# Patient Record
Sex: Female | Born: 1970 | Race: White | Hispanic: No
Health system: Midwestern US, Community
[De-identification: ages and names within clinical notes are randomized; demographics above are authoritative.]

## PROBLEM LIST (undated history)

## (undated) DIAGNOSIS — H469 Unspecified optic neuritis: Secondary | ICD-10-CM

## (undated) DIAGNOSIS — F329 Major depressive disorder, single episode, unspecified: Secondary | ICD-10-CM

## (undated) DIAGNOSIS — F419 Anxiety disorder, unspecified: Secondary | ICD-10-CM

## (undated) DIAGNOSIS — F32A Depression, unspecified: Secondary | ICD-10-CM

## (undated) DIAGNOSIS — F341 Dysthymic disorder: Secondary | ICD-10-CM

## (undated) DIAGNOSIS — Z1231 Encounter for screening mammogram for malignant neoplasm of breast: Principal | ICD-10-CM

## (undated) HISTORY — PX: CHOLECYSTECTOMY: SHX55

## (undated) HISTORY — DX: Anxiety disorder, unspecified: F41.9

## (undated) HISTORY — PX: ECTOPIC PREGNANCY SURGERY: SHX613

## (undated) HISTORY — DX: Depression, unspecified: F32.A

## (undated) HISTORY — DX: Major depressive disorder, single episode, unspecified: F32.9

## (undated) HISTORY — DX: Unspecified optic neuritis: H46.9

---

## 1998-01-03 ENCOUNTER — Other Ambulatory Visit: Admission: RE | Admit: 1998-01-03 | Discharge: 1998-01-03 | Payer: Self-pay | Admitting: Obstetrics and Gynecology

## 1998-07-23 ENCOUNTER — Inpatient Hospital Stay (HOSPITAL_COMMUNITY): Admission: AD | Admit: 1998-07-23 | Discharge: 1998-07-25 | Payer: Self-pay | Admitting: Obstetrics and Gynecology

## 1998-08-20 ENCOUNTER — Emergency Department (HOSPITAL_COMMUNITY): Admission: EM | Admit: 1998-08-20 | Discharge: 1998-08-20 | Payer: Self-pay | Admitting: Emergency Medicine

## 1998-08-25 ENCOUNTER — Other Ambulatory Visit: Admission: RE | Admit: 1998-08-25 | Discharge: 1998-08-25 | Payer: Self-pay | Admitting: Obstetrics and Gynecology

## 1998-09-05 ENCOUNTER — Encounter: Payer: Self-pay | Admitting: Internal Medicine

## 1998-09-05 ENCOUNTER — Ambulatory Visit (HOSPITAL_COMMUNITY): Admission: RE | Admit: 1998-09-05 | Discharge: 1998-09-05 | Payer: Self-pay | Admitting: Internal Medicine

## 1998-10-04 ENCOUNTER — Observation Stay (HOSPITAL_COMMUNITY): Admission: RE | Admit: 1998-10-04 | Discharge: 1998-10-05 | Payer: Self-pay | Admitting: General Surgery

## 1999-09-18 ENCOUNTER — Inpatient Hospital Stay (HOSPITAL_COMMUNITY): Admission: AD | Admit: 1999-09-18 | Discharge: 1999-09-18 | Payer: Self-pay | Admitting: Obstetrics and Gynecology

## 1999-09-19 ENCOUNTER — Inpatient Hospital Stay (HOSPITAL_COMMUNITY): Admission: AD | Admit: 1999-09-19 | Discharge: 1999-09-21 | Payer: Self-pay | Admitting: Obstetrics and Gynecology

## 1999-10-25 ENCOUNTER — Other Ambulatory Visit: Admission: RE | Admit: 1999-10-25 | Discharge: 1999-10-25 | Payer: Self-pay | Admitting: Obstetrics and Gynecology

## 2000-10-30 ENCOUNTER — Other Ambulatory Visit: Admission: RE | Admit: 2000-10-30 | Discharge: 2000-10-30 | Payer: Self-pay | Admitting: Obstetrics and Gynecology

## 2001-10-10 ENCOUNTER — Encounter: Admission: RE | Admit: 2001-10-10 | Discharge: 2001-10-10 | Payer: Self-pay | Admitting: Internal Medicine

## 2001-10-10 ENCOUNTER — Encounter: Payer: Self-pay | Admitting: Internal Medicine

## 2001-12-16 ENCOUNTER — Other Ambulatory Visit: Admission: RE | Admit: 2001-12-16 | Discharge: 2001-12-16 | Payer: Self-pay | Admitting: Obstetrics and Gynecology

## 2003-06-24 ENCOUNTER — Other Ambulatory Visit: Admission: RE | Admit: 2003-06-24 | Discharge: 2003-06-24 | Payer: Self-pay | Admitting: Obstetrics and Gynecology

## 2007-01-11 ENCOUNTER — Inpatient Hospital Stay (HOSPITAL_COMMUNITY): Admission: EM | Admit: 2007-01-11 | Discharge: 2007-01-13 | Payer: Self-pay | Admitting: Emergency Medicine

## 2007-02-24 ENCOUNTER — Ambulatory Visit (HOSPITAL_COMMUNITY): Admission: RE | Admit: 2007-02-24 | Discharge: 2007-02-24 | Payer: Self-pay | Admitting: *Deleted

## 2007-02-26 ENCOUNTER — Ambulatory Visit (HOSPITAL_COMMUNITY): Admission: RE | Admit: 2007-02-26 | Discharge: 2007-02-26 | Payer: Self-pay | Admitting: *Deleted

## 2008-06-05 IMAGING — RF DG FLUORO GUIDE NDL PLC/BX
1 series · 1 of 1 positions shown · non-contrast
Comparison: none

CLINICAL DATA: Recent optic neuritis.  Query MS.  
DIAGNOSTIC FLUROSCOPIC LUMBAR PUNCTURE:

[Series 1: run · 1 of 1 slices shown]
[im 1/1]
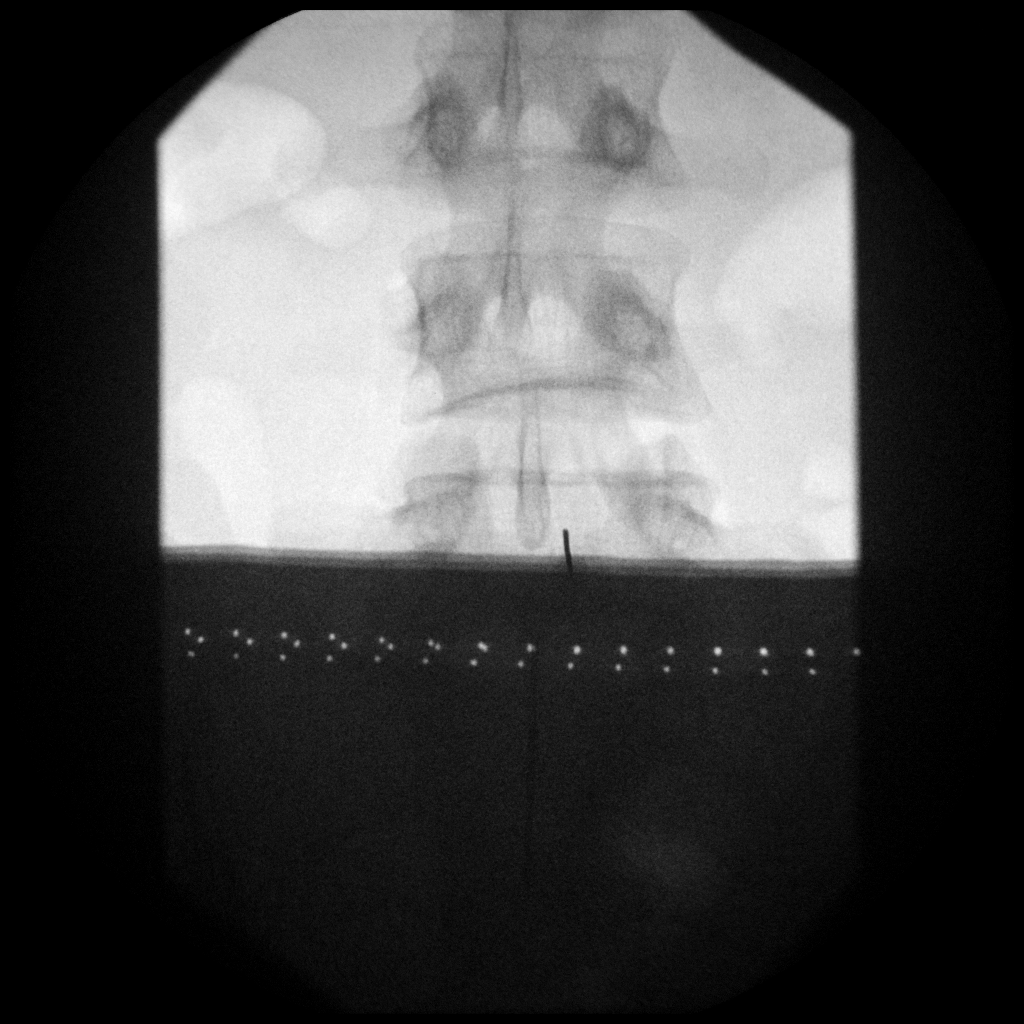

[1 of 1 positions shown; findings below may reference images not displayed]

FINDINGS: Informed consent was obtained.  Sterile preparation of the patient?s skin with Betadine x 3.  Local anesthesia was performed with subcutaneous injection of 1% Lidocaine.  I inserted a 20 gauge spinal needle into the subarachnoid space at the L4-5 level.  Opening pressure was 16 cm H2O.  I removed approximately 7 cc of clear, colorless CSF for requested lab studies.  Patient tolerated the procedure well without immediate complications.  Appropriate orders were written.
IMPRESSION: Successful diagnostic lumbar puncture.  Opening pressure was 16 cm H2O.

## 2011-02-09 NOTE — Discharge Summary (Signed)
Lauren Ballard, Lauren Ballard               ACCOUNT NO.:  [id number]   MEDICAL RECORD NO.:  [id number]          PATIENT TYPE:  INP   LOCATION:  3013                         FACILITY:  [facility]   PHYSICIAN:  Bevelyn Buckles. Champey, M.D.DATE OF BIRTH:  02/01/1971   DATE OF ADMISSION:  01/11/2007  DATE OF DISCHARGE:  01/13/2007                               DISCHARGE SUMMARY   REASON FOR ADMISSION:  Right optic neuritis with vision loss.   DISCHARGE MEDICATIONS:  Include a 6 day prednisone dose taper.   HOSPITAL COURSE:  This is a 40 year old Caucasian female with no  significant past medical history who presented with decreased vision in  the right eye and was found to have optic nerve edema and swelling.  The  patient was admitted.  She had vision loss for IV steroid treatment.  She was given 3 days of IV steroids at 1 gram per day.  The patient also  had an MRI of the brain for any demyelinating lesions.  The MRI of the  brain did not show any cortical lesions, it did show abnormal  enhancement of the right optic nerve.  The patient tolerated the IV  steroids well.  Vision improved.  The patient's headache and right eye  pain also improved at the first initial dose of steroids.  The patient  has an uncomplicated hospital stay and was stable on discharge with the  vision actually improved.   LABORATORY DATA:  The patient's laboratories including CBC, coag's, and  c-Met were all normal as well.   DISCHARGE INSTRUCTIONS:  The patient may return to work and activity as  tolerated.  The patient will have a followup appointment with Dr.  Nash Shearer in 1 to 2 months time.  Office number was given.  The  prescription was given for her prednisone atper.  The patient was told  to call the office with any questions or concerns.      Bevelyn Buckles. Nash Shearer, M.D.  Electronically Signed     DRC/MEDQ  D:  01/13/2007  T:  01/13/2007  Job:  [id number]

## 2011-02-09 NOTE — H&P (Signed)
Lauren Ballard, Lauren Ballard               ACCOUNT NO.:  [id number]   MEDICAL RECORD NO.:  [id number]          PATIENT TYPE:  EMS   LOCATION:  MAJO                         FACILITY:  [facility]   PHYSICIAN:  Bevelyn Buckles. Champey, M.D.DATE OF BIRTH:  [birth year]   DATE OF ADMISSION:  01/11/2007  DATE OF DISCHARGE:                              HISTORY & PHYSICAL   REQUESTING PHYSICIAN:  Dr. Hyacinth Meeker   REASON FOR ADMISSION:  Optic neuritis with decreased vision.   HISTORY OF PRESENT ILLNESS:  Lauren Ballard is a 40 year old Caucasian female  with no significant past medical history who presents with decreased  vision in her right eye.  I spoke with Dr. Hyacinth Meeker on the phone and  stated symptoms started five days ago with decreased vision in the  superior visual field.  Patient stated that she had a headache and  common cold in the past four days, however visual disturbance started  last evening around 10:00 p.m. when she developed visual field loss  superiorly in her right eye with a brown-like film.  She also stated  that her right eye hurt to move and she had severe right eye and head  pain since last evening.  Her vision got slightly worse over the day,  saw Dr. Hyacinth Meeker today who saw her and noticed a right Marcus Gunn pupil,  superior visual loss in the right eye and optic nerve edema.  I  discussed with Dr. Hyacinth Meeker and discussed to bring the patient to the  emergency room for IV steroids and admission.  Patient denies any other  symptoms of focal weakness, numbness, speech changes, swallowing  problems, chewing problems, dizziness, vertigo, falls or loss of  consciousness.   PAST MEDICAL HISTORY:  None.   CURRENT MEDICATIONS:  None.   ALLERGIES:  ARE TO PENICILLIN, AMOXICILLIN AND SULFA.   FAMILY HISTORY:  Positive for stroke and breast cancer.   SOCIAL HISTORY:  Patient lives with family, socially drinks alcohol,  denies any smoking or drug use.   REVIEW OF SYSTEMS:  Positive as per HPI and also  for a recent cold four  days ago.  Review of systems negative as per the HPI and greater than  eight other organ systems.   PHYSICAL EXAMINATION:  VITALS:  Temperature is 98.1, blood pressure  149/89, respirations 18, pulse is 94.  HEENT:  Normocephalic, atraumatic.  Extraocular muscles are intact, they  are painful to move.  Right eye has superior visual loss.  NECK:  Supple.  HEART:  Regular.  LUNGS:  Clear.  ABDOMEN:  Soft, nontender.  EXTREMITIES:  Show good pulses.  NEUROLOGICAL EXAMINATION:  Patient is awake, alert and oriented.  Language is fluent.  Cranial nerves:  Right eye has superior visual loss  and patient has painful eye movements on the right.  Face is symmetric.  Tongue is midline.  Motor examination shows 5/5 strength and normal tone  in all four extremities.  No drift is noted.  Sensory examination is  within normal limits to light touch.  Reflexes are 1 to 2+ throughout.  Cerebellar flexion is within normal limits Finger-to-nose  and heel-to-  shin.  Gait is unremarkable.  Patient does not have a Romberg sign  present.   IMPRESSION:  This is a 40 year old Caucasian female with optic neuritis  and visual loss in right eye.   Will admit the patient for three days of IV steroids followed by a p.o.  steroid taper.  We will check an MRI of the brain to look for any MS or  suspicious demyelinating lesions.  If demyelinating lesions are seen, we  will consider lumbar puncture for further workup.  We will follow  patient while she is in the hospital.      Bevelyn Buckles. Nash Shearer, M.D.  Electronically Signed     DRC/MEDQ  D:  01/11/2007  T:  01/12/2007  Job:  [id number]

## 2011-02-09 NOTE — H&P (Signed)
[facility]  Patient:    Lauren Ballard                       MRN: [id number] Adm. Date:  [id number] Attending:  Lenoard Aden CC:         [facility]                         History and Physical  CHIEF COMPLAINT:              Presumed macrosomia with history of shoulder dystocia and 4th degree laceration.  HISTORY OF PRESENT ILLNESS:   The patient is a 40 year old white female, G3, P1-0-1-1, EDD of September 23, 1999, at 39+ weeks, with an estimated fetal weight of approximately 8-1/2-9 pounds, and greater than the 90th percentile on ultrasound. History of suspected macrosomia, history of previous macrosomia with prolonged second stage shoulder dystocia and 4th degree laceration who presents with favorable cervix and elective induction now at term.  PAST MEDICAL HISTORY:         1. Remarkable for a vaginal delivery October 1999 of                                  a 9 pound female with 4th degree laceration and                                  shoulder dystocia.                               2. History of ectopic 1988.                               3. History of previous Clomid induced pregnancy.   ALLERGIES:                    PENICILLIN AND SULFA.  MEDICATIONS:                  1. Prenatal vitamins.                               2. Iron.  SOCIAL HISTORY:               Noncontributory.  FAMILY HISTORY:               Family history of _______ disease, rheumatoid arthritis, and breast cancer.  PAST SURGICAL HISTORY:        1. Remarkable for laparoscopy in 1998.                               2. Gallbladder in January 2000.  PREGNANCY HISTORY:            Remarkable for a blood type of O positive, Rh antibody negative, VDRL nonreactive, Rubella immune, hepatitis B surface antigen negative, group beta Streptococcus performed was negative.  PHYSICAL EXAMINATION  GENERAL APPEARANCE:           Well-developed, well-nourished white female,  in no apparent distress.  HEENT:  Normal limits.  LUNGS:                        Clear.  CARDIAC:                      Heart regular rate and rhythm.  ABDOMEN:                      Soft, gravid, nontender, estimated fetal weight 8 pounds.  BACK:                         No CVA tenderness.  PELVIC:                       Cervix is 2-3 cm, 50% vertex, and -1.  Artificial  rupture of membranes, clear.  EXTREMITIES:                  Reveal no cords.  NEUROLOGIC:                   Nonfocal.  IMPRESSION:                   1. Intrauterine pregnancy 39 weeks.                               2. Previous history of macrosomia with 4th degree                                  laceration, shoulder dystocia, and prolonged  active phase with presumed macrosomia in this                                  current pregnancy.  PLAN:                         To proceed with Pitocin induction and careful attempt at vaginal delivery. DD:  09/19/99 TD:  09/19/99 Job: [id number]

## 2011-07-12 LAB — CSF CELL COUNT WITH DIFFERENTIAL
Eosinophils, CSF: 0
RBC Count, CSF: 4 — ABNORMAL HIGH
RBC Count, CSF: 45 — ABNORMAL HIGH
Segmented Neutrophils-CSF: 0
Tube #: 1
Tube #: 3
WBC, CSF: 1
WBC, CSF: 2

## 2011-07-12 LAB — OLIGOCLONAL BANDS, CSF + SERM
Albumin Index: 3.2 ratio (ref 0.0–9.0)
Albumin, CSF: 14 mg/dL (ref 0–35)
CSF Oligoclonal Bands: NEGATIVE
IgG Index, CSF: 0.47 ratio (ref 0.28–0.66)
IgG, Serum: 1000 mg/dL (ref 768–1632)
MS CNS IgG Synthesis Rate: 0 mg/d (ref 0.0–8.0)

## 2011-07-12 LAB — HSV PCR: HSV, PCR: NEGATIVE

## 2011-07-12 LAB — VDRL, CSF: VDRL Quant, CSF: NONREACTIVE

## 2011-07-12 LAB — PROTEIN AND GLUCOSE, CSF
Glucose, CSF: 51
Total  Protein, CSF: 25

## 2012-08-16 ENCOUNTER — Ambulatory Visit: Payer: BC Managed Care – PPO | Admitting: Family Medicine

## 2012-08-16 VITALS — BP 125/85 | HR 80 | Temp 99.3°F | Resp 18 | Ht 59.5 in | Wt 109.0 lb

## 2012-08-16 DIAGNOSIS — N390 Urinary tract infection, site not specified: Secondary | ICD-10-CM

## 2012-08-16 LAB — POCT UA - MICROSCOPIC ONLY

## 2012-08-16 LAB — POCT URINALYSIS DIPSTICK
Bilirubin, UA: NEGATIVE
Glucose, UA: NEGATIVE
Nitrite, UA: POSITIVE
Spec Grav, UA: 1.015
Urobilinogen, UA: 1

## 2012-08-16 MED ORDER — CIPROFLOXACIN HCL 250 MG PO TABS: 250.0000 mg | ORAL_TABLET | Freq: Two times a day (BID) | ORAL | Status: DC

## 2012-08-16 NOTE — Progress Notes (Signed)
[facility]:  Office Visit  Chief Complaint:  Chief Complaint  Patient presents with  . Urinary Tract Infection    HPI: Lauren Ballard is a 41 y.o. female who complains of  Burning, urgency and achiness in legs. Does not get up and urinate. Denies fevers, chills but does feel warm. Had sex last night and started having sxs.  No back pain, no pelvic pain  History reviewed. No pertinent past medical history. Past Surgical History  Procedure Date  . Cholecystectomy   . Ectopic pregnancy surgery    History   Social History  . Marital Status: Married    Spouse Name: N/A    Number of Children: N/A  . Years of Education: N/A   Social History Main Topics  . Smoking status: Never Smoker   . Smokeless tobacco: None  . Alcohol Use: Yes     Comment: 3 x per week  . Drug Use: No  . Sexually Active: None   Other Topics Concern  . None   Social History Narrative  . None   Family History  Problem Relation Age of Onset  . Cancer Mother   . Cancer Maternal Grandfather   . Cancer Paternal Grandmother    Allergies  Allergen Reactions  . Amoxicillin   . Penicillins   . Sulfur    Prior to Admission medications   Not on File     ROS: The patient denies fevers, chills, night sweats, unintentional weight loss, chest pain, palpitations, wheezing, dyspnea on exertion, nausea, vomiting, abdominal pain, , hematuria, melena, numbness, weakness, or tingling  All other systems have been reviewed and were otherwise negative with the exception of those mentioned in the HPI and as above.    PHYSICAL EXAM: Filed Vitals:   08/16/12 1611  BP: 125/85  Pulse: 80  Temp: 99.3 F (37.4 C)  Resp: 18   Filed Vitals:   08/16/12 1611  Weight: 109 lb (49.442 kg)   There is no height on file to calculate BMI.  General: Alert, no acute distress HEENT:  Normocephalic, atraumatic, oropharynx patent.  Cardiovascular:  Regular rate and rhythm, no rubs murmurs or gallops.   No Carotid bruits, radial pulse intact. No pedal edema.  Respiratory: Clear to auscultation bilaterally.  No wheezes, rales, or rhonchi.  No cyanosis, no use of accessory musculature GI: No organomegaly, abdomen is soft and non-tender, positive bowel sounds.  No masses. Skin: No rashes. Neurologic: Facial musculature symmetric. Psychiatric: Patient is appropriate throughout our interaction. Lymphatic: No cervical lymphadenopathy Musculoskeletal: Gait intact. NO CVA tenderness   LABS: Results for orders placed in visit on 08/16/12  POCT URINALYSIS DIPSTICK      Component Value Range   Color, UA dark yellow     Clarity, UA clear     Glucose, UA neg     Bilirubin, UA neg     Ketones, UA neg     Spec Grav, UA 1.015     Blood, UA trace     pH, UA 7.0     Protein, UA neg     Urobilinogen, UA 1.0     Nitrite, UA pos     Leukocytes, UA large (3+)    POCT UA - MICROSCOPIC ONLY      Component Value Range   WBC, Ur, HPF, POC tntc     RBC, urine, microscopic tntc     Bacteria, U Microscopic 3+     Mucus, UA neg  Epithelial cells, urine per micros 1-2     Crystals, Ur, HPF, POC neg     Casts, Ur, LPF, POC neg     Yeast, UA neg       EKG/XRAY:   Primary read interpreted by Dr. Conley Rolls at [facility].   ASSESSMENT/PLAN: Encounter Diagnosis  Name Primary?  . UTI (lower urinary tract infection) Yes   Urine culture pending Cipro 250 mg BID x 3 days Last urine cx was resistant to macrobid    Lauren Albergo PHUONG, DO 08/16/2012 4:42 PM

## 2012-08-19 LAB — URINE CULTURE: Colony Count: >=100000

## 2012-08-20 ENCOUNTER — Telehealth: Payer: Self-pay | Admitting: Family Medicine

## 2012-08-20 DIAGNOSIS — N390 Urinary tract infection, site not specified: Secondary | ICD-10-CM

## 2012-08-20 MED ORDER — CIPROFLOXACIN HCL 250 MG PO TABS: 250.0000 mg | ORAL_TABLET | Freq: Two times a day (BID) | ORAL | Status: DC

## 2012-08-20 NOTE — Telephone Encounter (Signed)
Called pt regarding urine culture. She is better but sometimes she gets repeat UTIs. I will give her a refill on the cipro since I wanted to make sure the abx was sensitive ti bacteria before giving her a longer regiment.

## 2012-10-29 ENCOUNTER — Ambulatory Visit (INDEPENDENT_AMBULATORY_CARE_PROVIDER_SITE_OTHER): Payer: BC Managed Care – PPO | Admitting: Physician Assistant

## 2012-10-29 ENCOUNTER — Encounter: Payer: Self-pay | Admitting: Physician Assistant

## 2012-10-29 VITALS — BP 110/66 | HR 67 | Temp 98.1°F | Resp 16 | Ht 60.0 in | Wt 114.4 lb

## 2012-10-29 DIAGNOSIS — F329 Major depressive disorder, single episode, unspecified: Secondary | ICD-10-CM

## 2012-10-29 MED ORDER — CITALOPRAM HYDROBROMIDE 10 MG PO TABS: 10.0000 mg | ORAL_TABLET | Freq: Every day | ORAL | Status: DC

## 2012-10-29 NOTE — Progress Notes (Signed)
   [address] [place] [address]   Phone [phone]  Subjective:    Patient ID: Lauren Ballard, female    DOB: [birth year], 42 y.o.   MRN: [id number]  HPI  Pt presents to clinic at the request of her therapist Dr Ledon Snare.  She has been dealing with depression for several years.  Start with marital tensions and she was started on Prozac and it helped but then she started to noticed that her lack of attention and SE were disturbing to her so she was changed to Zoloft.  She felt that it helped a lot but about 6 months ago she stopped it because she felt like she was better.  Then in Dec she felt like her mood was again depressed.  Her good friend also noticed that her mood was down again.  She has no interest in things, does not seem to care or remember anything.  Feels like when she eats good and exercises things are better but in Dec she had a lot of stuff happen to her.  She restarted her Zoloft and started counseling.  She feels like on the Zoloft she is in a fog and cannot remember things from hour to hour. ? Maybe because she just does not care about anything.  She is still separated from her husband, has 2 teenage children.  Review of Systems  Psychiatric/Behavioral: Positive for dysphoric mood and decreased concentration. Negative for sleep disturbance (Zoloft does make her a little sleepy). The patient is nervous/anxious (minor and random - not typically a problem for the patient).        Objective:   Physical Exam  Vitals reviewed. Constitutional: She appears well-developed and well-nourished.  HENT:  Head: Normocephalic and atraumatic.  Right Ear: External ear normal.  Left Ear: External ear normal.  Eyes: Conjunctivae normal are normal.  Cardiovascular: Normal rate, regular rhythm and normal heart sounds.   No murmur heard. Pulmonary/Chest: Effort normal and breath sounds normal.  Skin: Skin is warm and dry.  Psychiatric: She has a normal mood and affect. Her behavior is  normal. Judgment and thought content normal.          Assessment & Plan:   1. Depression  citalopram (CELEXA) 10 MG tablet   Will start with 1/2 tablet for the 1st couple of days to decrease her SE and then she will increase to 1 pill a day.  Recommended taking in the am.  Pt can adjust up to 2 pills qd at the direction of Dr Ledon Snare. We will plan on F/u in 2 months, if pt has increased her dose to Celexa 20mg  at the time of her RF please let me know so I can send in the medication at a higher dose.  Answered pts questions, she understands and agrees with the above plan.

## 2012-12-24 ENCOUNTER — Other Ambulatory Visit: Payer: Self-pay | Admitting: Physician Assistant

## 2012-12-24 NOTE — Telephone Encounter (Signed)
Is pt still taking 10mg  or has she increased to 20mg ?

## 2012-12-25 NOTE — Telephone Encounter (Signed)
LMOM to CB. 

## 2012-12-26 NOTE — Telephone Encounter (Signed)
LMOM w/detailed message that we need to know her current dose of Celexa to send in RF.

## 2012-12-29 NOTE — Telephone Encounter (Signed)
LMOM for pt to CB.  

## 2012-12-30 NOTE — Telephone Encounter (Signed)
[name], I haven't had any luck getting pt to call me back. Do you want to send in RF of the 10 mg, or maybe for the 20 mg w/sig 1/2 - 1 tab QD? I hate for the pt to be without.

## 2013-01-16 NOTE — Telephone Encounter (Signed)
Lauren Ballard, pt has been taking the alprazolam 0.25 mg prn.

## 2013-01-16 NOTE — Telephone Encounter (Signed)
Lauren Ballard, pt CB. [place] had [place]. Pt reported that she has not gotten her RF yet and that she has increased her dose to 20 mg QD. She requests that the Rx be sent in for the citalopram 20 mg QD. Pt also wanted me to ask you if you would be willing to Rx the alprazolam Rx that she has been taking every now and then as needed? She had originally gotten it from the provider she used to see and doesn't take it very often but is down to about 3 tablets.

## 2013-02-17 ENCOUNTER — Telehealth: Payer: Self-pay | Admitting: Physician Assistant

## 2013-02-17 MED ORDER — CITALOPRAM HYDROBROMIDE 20 MG PO TABS: 20.0000 mg | ORAL_TABLET | Freq: Every day | ORAL | Status: DC

## 2013-02-17 MED ORDER — ALPRAZOLAM 0.25 MG PO TABS: 0.2500 mg | ORAL_TABLET | Freq: Every day | ORAL | Status: DC | PRN

## 2013-02-17 NOTE — Telephone Encounter (Signed)
She is doing well on the Celexa but needs a higher dose.  She is also having trouble on the wknds with anxiety because it is the 1st time in a long time she has been alone and needs something on a prn basis for anxiety.  Please send med to pharmacy for me - thanks.  Pt knows you do not need to call her.

## 2013-02-17 NOTE — Telephone Encounter (Signed)
FAXED

## 2013-06-03 ENCOUNTER — Other Ambulatory Visit: Payer: Self-pay | Admitting: Physician Assistant

## 2013-11-09 ENCOUNTER — Other Ambulatory Visit: Payer: Self-pay | Admitting: Physician Assistant

## 2013-11-10 NOTE — Telephone Encounter (Signed)
I have not seen patient is over a year she needs an Ov for medications.

## 2014-04-19 ENCOUNTER — Ambulatory Visit (INDEPENDENT_AMBULATORY_CARE_PROVIDER_SITE_OTHER): Payer: BC Managed Care – PPO | Admitting: Family Medicine

## 2014-04-19 ENCOUNTER — Encounter: Payer: Self-pay | Admitting: Family Medicine

## 2014-04-19 VITALS — BP 118/70 | HR 57 | Temp 98.5°F | Resp 16 | Ht 60.0 in | Wt 109.8 lb

## 2014-04-19 DIAGNOSIS — F329 Major depressive disorder, single episode, unspecified: Secondary | ICD-10-CM

## 2014-04-19 DIAGNOSIS — F3289 Other specified depressive episodes: Secondary | ICD-10-CM

## 2014-04-19 DIAGNOSIS — F32A Depression, unspecified: Secondary | ICD-10-CM

## 2014-04-19 MED ORDER — VENLAFAXINE HCL ER 37.5 MG PO CP24: 37.5000 mg | ORAL_CAPSULE | Freq: Every day | ORAL | Status: DC

## 2014-04-19 MED ORDER — ALPRAZOLAM 0.25 MG PO TABS: 0.2500 mg | ORAL_TABLET | Freq: Every day | ORAL | Status: DC | PRN

## 2014-04-19 NOTE — Progress Notes (Signed)
Subjective:    Patient ID: Lauren Ballard, female    DOB: [birth year], 43 y.o.   MRN: [id number]  04/19/2014  Medication Refill   HPI This 43 y.o. female presents for evaluation of depression with anxiety.  Last visit with Benny LennertSarah Weber on 10/2012; started on Celexa 10mg  1/2 daily for two weeks and then increased to 10mg  daily; was advised to increase to two tablets daily as recommended by therapist, Ledon SnareMcKnight. Stopped Celexa in 07/2013; was doing well emotionally until just recently.  Usually follows this trend; will take medication for 6-12 months and then be able to wean off of it; then will need to restart it 6-12 months later depending on stressors. Tried Wellbutrin but suffered with a horrible rash in the past. Works out a lot which really helps mood and overall well-being.   Underwent divorce four years ago; has joint custody of children.  Still really struggles when children not with her when they are with their father.  Celexa helps with mood but makes pt very tired and lacks motivation.  Currently admits to financial stressors; also in a relationship that is causing stressors.  Has undergone psychotherapy in the past and just saw therapist Ledon SnareMcKnight last week; plans to continue therapy.  Denies SI/HI.    Gyn/Taavon.  Review of Systems  Constitutional: Negative for fever, chills, diaphoresis and fatigue.  Neurological: Negative for dizziness, tremors, seizures, syncope, speech difficulty, weakness, light-headedness, numbness and headaches.  Psychiatric/Behavioral: Positive for sleep disturbance and dysphoric mood. Negative for suicidal ideas, self-injury and decreased concentration. The patient is nervous/anxious.     Past Medical History  Diagnosis Date  . Depression    Past Surgical History  Procedure Laterality Date  . Cholecystectomy    . Ectopic pregnancy surgery     Allergies  Allergen Reactions  . Amoxicillin   . Penicillins Hives  . Sulfur Rash   Current Outpatient  Prescriptions  Medication Sig Dispense Refill  . ALPRAZolam (XANAX) 0.25 MG tablet Take 1 tablet (0.25 mg total) by mouth daily as needed for sleep.  20 tablet  0  . citalopram (CELEXA) 20 MG tablet Take 1 tablet (20 mg total) by mouth daily. PATIENT NEEDS OFFICE VISIT FOR ADDITIONAL REFILLS  30 tablet  0  . citalopram (CELEXA) 10 MG tablet TAKE 1 TABLET BY MOUTH DAILY  30 tablet  0  . venlafaxine XR (EFFEXOR-XR) 37.5 MG 24 hr capsule Take 1 capsule (37.5 mg total) by mouth daily with breakfast.  30 capsule  5   No current facility-administered medications for this visit.   History   Social History  . Marital Status: Married    Spouse Name: N/A    Number of Children: N/A  . Years of Education: N/A   Occupational History  . Not on file.   Social History Main Topics  . Smoking status: Never Smoker   . Smokeless tobacco: Not on file  . Alcohol Use: Yes     Comment: 3 x per week  . Drug Use: No  . Sexual Activity: Not on file   Other Topics Concern  . Not on file   Social History Narrative   Marital status: divorced; dating seriously.      Two children      Lives: with 2 children       Employment       Tobacco: none      Alcohol: weekends      Drugs: none      Exercise: 5  days per week.         Objective:    BP 118/70  Pulse 57  Temp(Src) 98.5 F (36.9 C) (Oral)  Resp 16  Ht 5' (1.524 m)  Wt 109 lb 12.8 oz (49.805 kg)  BMI 21.44 kg/m2  SpO2 98%  LMP 04/17/2014 Physical Exam  Nursing note and vitals reviewed. Constitutional: She is oriented to person, place, and time. She appears well-developed and well-nourished. No distress.  HENT:  Head: Normocephalic and atraumatic.  Eyes: Conjunctivae are normal. Pupils are equal, round, and reactive to light.  Neck: Normal range of motion. Neck supple. No thyromegaly present.  Cardiovascular: Normal rate, regular rhythm and normal heart sounds.  Exam reveals no gallop and no friction rub.   No murmur  heard. Pulmonary/Chest: Effort normal and breath sounds normal. She has no wheezes. She has no rales.  Lymphadenopathy:    She has no cervical adenopathy.  Neurological: She is alert and oriented to person, place, and time. No cranial nerve deficit. She exhibits normal muscle tone. Coordination normal.  Skin: She is not diaphoretic.  Psychiatric: She has a normal mood and affect. Her behavior is normal. Judgment and thought content normal.        Assessment & Plan:   1. Depression    1. Depression: recurrent; suffered with side effects to Celexa including fatigue/hypersomnolence; rx for Effexor 37.5mg  daily provided; refill of Xanax also provided.  Highly encourage continued psychotherapy and daily exercise.  Meds ordered this encounter  Medications  . venlafaxine XR (EFFEXOR-XR) 37.5 MG 24 hr capsule    Sig: Take 1 capsule (37.5 mg total) by mouth daily with breakfast.    Dispense:  30 capsule    Refill:  5  . ALPRAZolam (XANAX) 0.25 MG tablet    Sig: Take 1 tablet (0.25 mg total) by mouth daily as needed for sleep.    Dispense:  20 tablet    Refill:  0    Return in about 2 months (around 06/20/2014) for recheck.    [name], M.D.  [facility] [address] [place], [place]  [address] [phone] phone [phone] fax

## 2014-04-20 ENCOUNTER — Telehealth: Payer: Self-pay | Admitting: Family Medicine

## 2014-04-20 NOTE — Telephone Encounter (Signed)
[facility] [address]

## 2014-05-05 ENCOUNTER — Telehealth: Payer: Self-pay

## 2014-05-05 NOTE — Telephone Encounter (Signed)
Lm for rtn call 

## 2014-05-05 NOTE — Telephone Encounter (Signed)
° ° °  Patient has a question on the medication Dr. Katrinka BlazingSmith gave her last week.  Patient did not know the name of it.  York SpanielSaid it was the only one Dr. Katrinka BlazingSmith gave her at her last OV.    Please call [phone]

## 2014-05-08 NOTE — Telephone Encounter (Signed)
Spoke with pt. She says that she has been taking the Effexor qam and she's finding that it really doesn't kick in until noon and then by night time she feels that it has worn off and her mind is racing. Wanted to know if she could take it at night. Told her that it was very reasonable to try taking it at night and see how she does and if after a couple of weeks, it's still not working for her like she wants, to RTC to discuss other options.

## 2014-06-21 ENCOUNTER — Encounter: Payer: BC Managed Care – PPO | Admitting: Family Medicine

## 2014-06-22 NOTE — Progress Notes (Signed)
This encounter was created in error - please disregard.

## 2014-10-29 ENCOUNTER — Other Ambulatory Visit: Payer: Self-pay | Admitting: Family Medicine

## 2014-11-04 ENCOUNTER — Ambulatory Visit (INDEPENDENT_AMBULATORY_CARE_PROVIDER_SITE_OTHER): Payer: BLUE CROSS/BLUE SHIELD | Admitting: Family Medicine

## 2014-11-04 ENCOUNTER — Encounter: Payer: Self-pay | Admitting: Family Medicine

## 2014-11-04 VITALS — BP 113/75 | HR 71 | Temp 97.9°F | Resp 16 | Ht 60.0 in | Wt 112.0 lb

## 2014-11-04 DIAGNOSIS — F32A Depression, unspecified: Secondary | ICD-10-CM

## 2014-11-04 DIAGNOSIS — F329 Major depressive disorder, single episode, unspecified: Secondary | ICD-10-CM

## 2014-11-04 MED ORDER — VENLAFAXINE HCL ER 37.5 MG PO CP24: ORAL_CAPSULE | ORAL | Status: DC

## 2014-11-04 NOTE — Patient Instructions (Signed)
We drew labs today to check your thyroid levels, your vitamin d level, and your vitamin b12 level. I'll be in touch with you with those results. Let's plan to slowly titrate the effexor medication up. Eventually making your way up to 75 mg per day of effexor slowly over the next few months is a good idea to maybe help you avoid the fog. Good luck with the possible job transition.  Please call and schedule with Dr Ledon SnareMcKnight to see him sometime in the next couple months. We'd like to see you back in 3 months to see how you're doing with the new dose and check in.

## 2014-11-04 NOTE — Progress Notes (Signed)
Subjective:    Patient ID: Lauren Ballard, female    DOB: Mar 25, 1971, 44 y.o.   MRN: [id number]  Chief Complaint  Patient presents with  . Medication Refill   Patient Active Problem List   Diagnosis Date Noted  . Depression 10/29/2012   Prior to Admission medications   Medication Sig Start Date End Date Taking? Authorizing Provider  ALPRAZolam (XANAX) 0.25 MG tablet Take 1 tablet (0.25 mg total) by mouth daily as needed for sleep. 04/19/14  Yes Ethelda ChickKristi M Smith, MD  venlafaxine XR (EFFEXOR-XR) 37.5 MG 24 hr capsule Take 1-2 capsules by mouth every morning with breakfast. 11/04/14  Yes Raelyn Ensignodd Charlesia Canaday, PA    Medications, allergies, past medical history, surgical history, family history, social history and problem list reviewed and updated.   HPI  [age] yof rtc for depression f/u.  Was intially seen 2 yrs ago. At that time mentioned had been depressed for few yrs since divorce. She had been seeing therpist Dr Ledon SnareMcKnight. She mentioned that she had tried both prozac and zoloft in the past. Both of these had initially worked but wore off. Then as she went up on dose she felt foggy so dc both of them. Two yrs ago we started her on celexa.  She then stopped the celexa 1.5 yrs ago due to decreased efficacy and increased fogginess. Tried wellbutrin but got rash.   She was seen here 6 months ago. Mentioned celexa was decreasing energy. Was started on effexor 37.5 mg and told to continue therapy. Was started on xanax prn for sleep.  Today she is doing ok. Divorce is 44 yrs old but continues to bother her. Her kids are 3915 and 44 yo. They split time with her and ex. She used to see them even on her off weeks to run them around places but now that one of them is 16 she is rarely seeing them on her off weeks. This really effects her. She was in a relationship for one year but broke up 6 months ago. She works from home. There are days when she doesn't leave the house at all. Sometimes if she is feeling down  she'll nap during the day since she doesn't know what else to do. She thinks she may function better if she worked outside of the house.   She hasn't seen Dr Ledon SnareMcKnight in several months. Continues to exercise most days of the week.   She mentions she did think very briefly of suicide in the fall when things were bad. She never had a plan. This has improved and she is no longer having those thoughts.   Review of Systems No cp, sob, palps.     Objective:   Physical Exam  Constitutional: She is oriented to person, place, and time. She appears well-developed and well-nourished.  Non-toxic appearance. She does not have a sickly appearance. She does not appear ill. No distress.  BP 113/75 mmHg  Pulse 71  Temp(Src) 97.9 F (36.6 C)  Resp 16  Ht 5' (1.524 m)  Wt 112 lb (50.803 kg)  BMI 21.87 kg/m2  SpO2 94%  LMP 10/21/2014   Neurological: She is alert and oriented to person, place, and time.  Psychiatric: Her speech is normal and behavior is normal. Judgment and thought content normal. Her mood appears anxious. Her affect is not angry. Cognition and memory are normal. She does not exhibit a depressed mood.  Slightly anxious in room. Crying and fighting back tears through good portion of the  interview.   PHQ9 = 7      Assessment & Plan:   73 yof rtc for depression f/u.  Depression - Plan: Vitamin B12, Vitamin D 1,25 dihydroxy, TSH, venlafaxine XR (EFFEXOR-XR) 37.5 MG 24 hr capsule --stable --slowly increase effexor from 37.5 to 75 mg qd, increasing slowly over months as pt has hx getting fatigue and fogginess on ssris and having to stop them  --tsh, vit b12, vit d --encouraged f/u with therapist Dr Ledon Snare --encouraged progression to working outside of the home some of the week in order to get out of house which may help her to feel better --continue exercise --rtc 3 months  Donnajean Lopes, PA-C Physician Assistant-Certified [facility]  11/04/2014 3:55 PM

## 2014-11-05 ENCOUNTER — Encounter: Payer: Self-pay | Admitting: Family Medicine

## 2014-11-05 LAB — TSH: TSH: 1.728 u[IU]/mL (ref 0.350–4.500)

## 2014-11-05 LAB — VITAMIN B12: Vitamin B-12: 432 pg/mL (ref 211–911)

## 2014-11-05 NOTE — Progress Notes (Signed)
S; This 44 y.o. Female has depression, previously evaluated and treated by S. Weber, PA-C and Nilda SimmerKristi Smith, MD. She was last seen in July 2015, w/ instructions for follow-up in 2 months. She has been trying to cope w/ multiple stressors since last visit. She discussed these in detail as per Raelyn Ensignodd McVeigh, PA-C's documentation.   Current medication - venlafaxine XR 37.5 mg 1 capsule daily is well tolerated. Had brief period of suicidal ideation w/o plan but no longer having those thoughts. Plans to return to therapy w/ Dr. Ledon SnareMcKnight.  PMHx, SOC and FAM HX reviewed.  MEDICATIONS reconciled.  ROS: As per HPI. Fatigue and excess sleep.  O: Filed Vitals:   11/04/14 1435  BP: 113/75  Pulse: 71  Temp: 97.9 F (36.6 C)  Resp: 16    Exam as per PA-C's documentation. This pt was discussed with me.  A/P: Depression - Plan: Vitamin B12, Vitamin D 1,25 dihydroxy, TSH, venlafaxine XR (EFFEXOR-XR) 37.5 MG 24 hr capsule  T. McVeigh, PA-C reviewed his assessment and plan with me; I agree with increase dose of venlafaxine to 75 mg daily. This is a low dose and should be well tolerated (monitor BP and reports of excessive sweating).  B.Ayson Cherubini, MD [facility]

## 2014-11-07 LAB — VITAMIN D 1,25 DIHYDROXY
VITAMIN D 1, 25 (OH) TOTAL: 71 pg/mL (ref 18–72)
VITAMIN D3 1, 25 (OH): 71 pg/mL

## 2014-11-28 ENCOUNTER — Other Ambulatory Visit: Payer: Self-pay | Admitting: Physician Assistant

## 2014-12-31 ENCOUNTER — Other Ambulatory Visit: Payer: Self-pay

## 2014-12-31 DIAGNOSIS — F329 Major depressive disorder, single episode, unspecified: Secondary | ICD-10-CM

## 2014-12-31 DIAGNOSIS — F32A Depression, unspecified: Secondary | ICD-10-CM

## 2014-12-31 MED ORDER — VENLAFAXINE HCL ER 37.5 MG PO CP24: ORAL_CAPSULE | ORAL | Status: DC

## 2016-03-13 ENCOUNTER — Other Ambulatory Visit: Payer: Self-pay | Admitting: Physician Assistant

## 2016-03-14 ENCOUNTER — Telehealth: Payer: Self-pay

## 2016-03-14 NOTE — Telephone Encounter (Signed)
Pt is needing a refill on effexor xr   Best number [phone]

## 2016-03-15 NOTE — Telephone Encounter (Signed)
Notified pt on VM of RF and need for f/up for any more RFs.

## 2016-05-23 ENCOUNTER — Encounter: Payer: Self-pay | Admitting: Family Medicine

## 2016-05-23 ENCOUNTER — Ambulatory Visit (INDEPENDENT_AMBULATORY_CARE_PROVIDER_SITE_OTHER): Payer: BLUE CROSS/BLUE SHIELD | Admitting: Family Medicine

## 2016-05-23 VITALS — BP 110/72 | HR 66 | Temp 98.1°F | Resp 16 | Ht 59.5 in | Wt 117.8 lb

## 2016-05-23 DIAGNOSIS — F329 Major depressive disorder, single episode, unspecified: Secondary | ICD-10-CM

## 2016-05-23 DIAGNOSIS — F32A Depression, unspecified: Secondary | ICD-10-CM

## 2016-05-23 MED ORDER — VENLAFAXINE HCL ER 37.5 MG PO CP24: 37.5000 mg | ORAL_CAPSULE | Freq: Every day | ORAL | 5 refills | Status: DC

## 2016-05-23 NOTE — Patient Instructions (Addendum)
   IF you received an x-ray today, you will receive an invoice from [facility]. Please contact [facility] at [phone] with questions or concerns regarding your invoice.   IF you received labwork today, you will receive an invoice from [facility]. Please contact [facility] at [phone] with questions or concerns regarding your invoice.   Our billing staff will not be able to assist you with questions regarding bills from these companies.  You will be contacted with the lab results as soon as they are available. The fastest way to get your results is to activate your My Chart account. Instructions are located on the last page of this paperwork. If you have not heard from us regarding the results in 2 weeks, please contact this office.     Major Depressive Disorder Major depressive disorder is a mental illness. It also may be called clinical depression or unipolar depression. Major depressive disorder usually causes feelings of sadness, hopelessness, or helplessness. Some people with this disorder do not feel particularly sad but lose interest in doing things they used to enjoy (anhedonia). Major depressive disorder also can cause physical symptoms. It can interfere with work, school, relationships, and other normal everyday activities. The disorder varies in severity but is longer lasting and more serious than the sadness we all feel from time to time in our lives. Major depressive disorder often is triggered by stressful life events or major life changes. Examples of these triggers include divorce, loss of your job or home, a move, and the death of a family member or close friend. Sometimes this disorder occurs for no obvious reason at all. People who have family members with major depressive disorder or bipolar disorder are at higher risk for developing this disorder, with or without life stressors. Major depressive disorder can occur at any age.  It may occur just once in your life (single episode major depressive disorder). It may occur multiple times (recurrent major depressive disorder). SYMPTOMS People with major depressive disorder have either anhedonia or depressed mood on nearly a daily basis for at least 2 weeks or longer. Symptoms of depressed mood include:  Feelings of sadness (blue or down in the dumps) or emptiness.  Feelings of hopelessness or helplessness.  Tearfulness or episodes of crying (may be observed by others).  Irritability (children and adolescents). In addition to depressed mood or anhedonia or both, people with this disorder have at least four of the following symptoms:  Difficulty sleeping or sleeping too much.   Significant change (increase or decrease) in appetite or weight.   Lack of energy or motivation.  Feelings of guilt and worthlessness.   Difficulty concentrating, remembering, or making decisions.  Unusually slow movement (psychomotor retardation) or restlessness (as observed by others).   Recurrent wishes for death, recurrent thoughts of self-harm (suicide), or a suicide attempt. People with major depressive disorder commonly have persistent negative thoughts about themselves, other people, and the world. People with severe major depressive disorder may experiencedistorted beliefs or perceptions about the world (psychotic delusions). They also may see or hear things that are not real (psychotic hallucinations). DIAGNOSIS Major depressive disorder is diagnosed through an assessment by your health care provider. Your health care provider will ask aboutaspects of your daily life, such as mood,sleep, and appetite, to see if you have the diagnostic symptoms of major depressive disorder. Your health care provider may ask about your medical history and use of alcohol or drugs, including prescription medicines. Your health care provider   also may do a physical exam and blood work. This is because  certain medical conditions and the use of certain substances can cause major depressive disorder-like symptoms (secondary depression). Your health care provider also may refer you to a mental health specialist for further evaluation and treatment. TREATMENT It is important to recognize the symptoms of major depressive disorder and seek treatment. The following treatments can be prescribed for this disorder:   Medicine. Antidepressant medicines usually are prescribed. Antidepressant medicines are thought to correct chemical imbalances in the brain that are commonly associated with major depressive disorder. Other types of medicine may be added if the symptoms do not respond to antidepressant medicines alone or if psychotic delusions or hallucinations occur.  Talk therapy. Talk therapy can be helpful in treating major depressive disorder by providing support, education, and guidance. Certain types of talk therapy also can help with negative thinking (cognitive behavioral therapy) and with relationship issues that trigger this disorder (interpersonal therapy). A mental health specialist can help determine which treatment is best for you. Most people with major depressive disorder do well with a combination of medicine and talk therapy. Treatments involving electrical stimulation of the brain can be used in situations with extremely severe symptoms or when medicine and talk therapy do not work over time. These treatments include electroconvulsive therapy, transcranial magnetic stimulation, and vagal nerve stimulation.   This information is not intended to replace advice given to you by your health care provider. Make sure you discuss any questions you have with your health care provider.   Document Released: 01/05/2013 Document Revised: 10/01/2014 Document Reviewed: 01/05/2013 Elsevier Interactive Patient Education 2016 Elsevier Inc.  
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
## 2017-02-23 ENCOUNTER — Encounter: Payer: Self-pay | Admitting: Family Medicine

## 2017-02-23 ENCOUNTER — Ambulatory Visit (INDEPENDENT_AMBULATORY_CARE_PROVIDER_SITE_OTHER): Payer: Managed Care, Other (non HMO) | Admitting: Family Medicine

## 2017-02-23 VITALS — BP 143/88 | HR 77 | Temp 98.3°F | Resp 16 | Ht 60.0 in | Wt 116.2 lb

## 2017-02-23 DIAGNOSIS — F329 Major depressive disorder, single episode, unspecified: Secondary | ICD-10-CM | POA: Diagnosis not present

## 2017-02-23 DIAGNOSIS — F419 Anxiety disorder, unspecified: Secondary | ICD-10-CM | POA: Diagnosis not present

## 2017-02-23 DIAGNOSIS — F32A Depression, unspecified: Secondary | ICD-10-CM

## 2017-02-23 MED ORDER — VENLAFAXINE HCL ER 37.5 MG PO CP24: 75.0000 mg | ORAL_CAPSULE | Freq: Every day | ORAL | 5 refills | Status: DC

## 2017-02-23 MED ORDER — ALPRAZOLAM 0.25 MG PO TABS: 0.2500 mg | ORAL_TABLET | Freq: Every day | ORAL | 0 refills | Status: DC | PRN

## 2017-02-23 NOTE — Progress Notes (Signed)
Subjective:    Patient ID: Lauren Ballard, female    DOB: May 28, 1971, 46 y.o.   MRN: [id number]  02/23/2017  Anxiety   HPI This 46 y.o. female presents for evaluation of generalized anxiety disorder.  Not doing well right now emotionally; so tired of being alone. Dr. Ledon SnareMcKnight of psychology; no recent visit.   Making self exercise. Break up this week after six weeks. Lauren Ballard is graduating from HS. [employer]. Has been taking Effexor XR 75mg  daily.  Increased two for several months. Has been taking Gaba lately; not helpful right now; needs Xanax. Denies SI/HI.   BP Readings from Last 3 Encounters:  02/23/17 (!) 143/88  05/23/16 110/72  11/04/14 113/75   Wt Readings from Last 3 Encounters:  02/23/17 116 lb 4 oz (52.7 kg)  05/23/16 117 lb 12.8 oz (53.4 kg)  11/04/14 112 lb (50.8 kg)   Immunization History  Administered Date(s) Administered  . Tdap 09/24/2013      Review of Systems  Constitutional: Negative for chills, diaphoresis, fatigue and fever.  Eyes: Negative for visual disturbance.  Respiratory: Negative for cough and shortness of breath.   Cardiovascular: Negative for chest pain, palpitations and leg swelling.  Gastrointestinal: Negative for abdominal pain, constipation, diarrhea, nausea and vomiting.  Endocrine: Negative for cold intolerance, heat intolerance, polydipsia, polyphagia and polyuria.  Neurological: Negative for dizziness, tremors, seizures, syncope, facial asymmetry, speech difficulty, weakness, light-headedness, numbness and headaches.  Psychiatric/Behavioral: Positive for dysphoric mood and sleep disturbance. Negative for self-injury and suicidal ideas. The patient is nervous/anxious.     Past Medical History:  Diagnosis Date  . Depression    Past Surgical History:  Procedure Laterality Date  . CHOLECYSTECTOMY    . ECTOPIC PREGNANCY SURGERY     Allergies  Allergen Reactions  . Wellbutrin [Bupropion] Rash  . Amoxicillin   .  Penicillins Hives  . Sulfur Rash    Social History   Social History  . Marital status: Married    Spouse name: N/A  . Number of children: N/A  . Years of education: N/A   Occupational History  . Not on file.   Social History Main Topics  . Smoking status: Never Smoker  . Smokeless tobacco: Never Used  . Alcohol use 0.6 oz/week    1 Standard drinks or equivalent per week     Comment: 3 x per week  . Drug use: No  . Sexual activity: Not on file   Other Topics Concern  . Not on file   Social History Narrative   Marital status: divorced; not dating in 2017.      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       Tobacco: none      Alcohol: weekends once per week at most.      Drugs: none      Exercise: 3-5 days per week.     Family History  Problem Relation Age of Onset  . Cancer Mother 3140       breast cancer  . Depression Mother   . Dementia Mother   . Cancer Maternal Grandfather   . Cancer Paternal Grandmother   . Depression Father        Objective:    BP (!) 143/88   Pulse 77  Temp 98.3 F (36.8 C) (Oral)   Resp 16   Ht 5' (1.524 m)   Wt 116 lb 4 oz (52.7 kg)   SpO2 99%   BMI 22.70 kg/m  Physical Exam  Constitutional: She is oriented to person, place, and time. She appears well-developed and well-nourished. No distress.  HENT:  Head: Normocephalic and atraumatic.  Right Ear: External ear normal.  Left Ear: External ear normal.  Nose: Nose normal.  Mouth/Throat: Oropharynx is clear and moist.  Eyes: Conjunctivae and EOM are normal. Pupils are equal, round, and reactive to light.  Neck: Normal range of motion. Neck supple. Carotid bruit is not present. No thyromegaly present.  Cardiovascular: Normal rate, regular rhythm, normal heart sounds and intact distal pulses.  Exam reveals no  gallop and no friction rub.   No murmur heard. Pulmonary/Chest: Effort normal and breath sounds normal. She has no wheezes. She has no rales.  Abdominal: Soft. Bowel sounds are normal. She exhibits no distension and no mass. There is no tenderness. There is no rebound and no guarding.  Lymphadenopathy:    She has no cervical adenopathy.  Neurological: She is alert and oriented to person, place, and time. No cranial nerve deficit.  Skin: Skin is warm and dry. No rash noted. She is not diaphoretic. No erythema. No pallor.  Psychiatric: She has a normal mood and affect. Her behavior is normal. Judgment and thought content normal.   Results for orders placed or performed in visit on 11/04/14  Vitamin B12  Result Value Ref Range   Vitamin B-12 432 211 - 911 pg/mL  Vitamin D 1,25 dihydroxy  Result Value Ref Range   Vitamin D 1, 25 (OH)2 Total 71 18 - 72 pg/mL   Vitamin D3 1, 25 (OH)2 71 pg/mL   Vitamin D2 1, 25 (OH)2 <8 pg/mL  TSH  Result Value Ref Range   TSH 1.728 0.350 - 4.500 uIU/mL       Assessment & Plan:   1. Anxiety and depression    -worsening due to stressors including son's graduation, recent break up with short-term boyfriend; counseling provided in office for 30 minutes face to face.  -contact therapist for appointment. -increase Effexor XR to 100mg  daily. -rx for Xanax for sparing use. -contracted safety -continue daily exercise for depression/anxiety/stress management. -prolonged face-to-face for 30 minutes with greater than 50% of time dedicated to counseling and coordination of care.   No orders of the defined types were placed in this encounter.  Meds ordered this encounter  Medications  . LORYNA 3-0.02 MG tablet  . nitrofurantoin, macrocrystal-monohydrate, (MACROBID) 100 MG capsule    Sig: as needed.  Marland Kitchen DISCONTD: ALPRAZolam (XANAX) 0.25 MG tablet    Sig: Take 1 tablet (0.25 mg total) by mouth daily as needed for sleep.    Dispense:  20 tablet    Refill:  0   . ALPRAZolam (XANAX) 0.25 MG tablet    Sig: Take 1 tablet (0.25 mg total) by mouth daily as needed for sleep.    Dispense:  30 tablet    Refill:  0  . venlafaxine XR (EFFEXOR-XR) 37.5 MG 24 hr capsule    Sig: Take 2-3 capsules (75-112.5 mg total) by mouth daily with breakfast.    Dispense:  90 capsule    Refill:  5    No Follow-up on file.   Ahmarion Saraceno Paulita Fujita, M.D. Primary Care at [facility] [address] [place], [place]  [address] [phone] phone [phone] fax

## 2017-02-23 NOTE — Patient Instructions (Signed)
Call Dr. Ledon SnareMcKnight this week for an appointment.       IF you received an x-ray today, you will receive an invoice from [facility]. Please contact [facility] at [phone] with questions or concerns regarding your invoice.   IF you received labwork today, you will receive an invoice from [facility]. Please contact [facility] at [phone] with questions or concerns regarding your invoice.   Our billing staff will not be able to assist you with questions regarding bills from these companies.  You will be contacted with the lab results as soon as they are available. The fastest way to get your results is to activate your My Chart account. Instructions are located on the last page of this paperwork. If you have not heard from us regarding the results in 2 weeks, please contact this office.

## 2017-05-06 ENCOUNTER — Ambulatory Visit (INDEPENDENT_AMBULATORY_CARE_PROVIDER_SITE_OTHER): Payer: Managed Care, Other (non HMO) | Admitting: Family Medicine

## 2017-05-06 ENCOUNTER — Encounter: Payer: Self-pay | Admitting: Family Medicine

## 2017-05-06 VITALS — BP 114/71 | HR 65 | Temp 98.4°F | Resp 16 | Ht 60.0 in | Wt 117.0 lb

## 2017-05-06 DIAGNOSIS — R05 Cough: Secondary | ICD-10-CM

## 2017-05-06 DIAGNOSIS — J22 Unspecified acute lower respiratory infection: Secondary | ICD-10-CM | POA: Diagnosis not present

## 2017-05-06 DIAGNOSIS — R059 Cough, unspecified: Secondary | ICD-10-CM

## 2017-05-06 MED ORDER — AZITHROMYCIN 250 MG PO TABS: ORAL_TABLET | ORAL | 0 refills | Status: DC

## 2017-05-06 NOTE — Patient Instructions (Addendum)
Overall your exam is reassuring. Cough may be due to a bronchitis. Try Mucinex or Mucinex DM, then if not improving can start antibiotic as we discussed. If you do run fevers, are short of breath, or worsening, please return for repeat exam and possible chest x-ray.   Acute Bronchitis, Adult Acute bronchitis is sudden (acute) swelling of the air tubes (bronchi) in the lungs. Acute bronchitis causes these tubes to fill with mucus, which can make it hard to breathe. It can also cause coughing or wheezing. In adults, acute bronchitis usually goes away within 2 weeks. A cough caused by bronchitis may last up to 3 weeks. Smoking, allergies, and asthma can make the condition worse. Repeated episodes of bronchitis may cause further lung problems, such as chronic obstructive pulmonary disease (COPD). What are the causes? This condition can be caused by germs and by substances that irritate the lungs, including:  Cold and flu viruses. This condition is most often caused by the same virus that causes a cold.  Bacteria.  Exposure to tobacco smoke, dust, fumes, and air pollution.  What increases the risk? This condition is more likely to develop in people who:  Have close contact with someone with acute bronchitis.  Are exposed to lung irritants, such as tobacco smoke, dust, fumes, and vapors.  Have a weak immune system.  Have a respiratory condition such as asthma.  What are the signs or symptoms? Symptoms of this condition include:  A cough.  Coughing up clear, yellow, or green mucus.  Wheezing.  Chest congestion.  Shortness of breath.  A fever.  Body aches.  Chills.  A sore throat.  How is this diagnosed? This condition is usually diagnosed with a physical exam. During the exam, your health care provider may order tests, such as chest X-rays, to rule out other conditions. He or she may also:  Test a sample of your mucus for bacterial infection.  Check the level of oxygen  in your blood. This is done to check for pneumonia.  Do a chest X-ray or lung function testing to rule out pneumonia and other conditions.  Perform blood tests.  Your health care provider will also ask about your symptoms and medical history. How is this treated? Most cases of acute bronchitis clear up over time without treatment. Your health care provider may recommend:  Drinking more fluids. Drinking more makes your mucus thinner, which may make it easier to breathe.  Taking a medicine for a fever or cough.  Taking an antibiotic medicine.  Using an inhaler to help improve shortness of breath and to control a cough.  Using a cool mist vaporizer or humidifier to make it easier to breathe.  Follow these instructions at home: Medicines  Take over-the-counter and prescription medicines only as told by your health care provider.  If you were prescribed an antibiotic, take it as told by your health care provider. Do not stop taking the antibiotic even if you start to feel better. General instructions  Get plenty of rest.  Drink enough fluids to keep your urine clear or pale yellow.  Avoid smoking and secondhand smoke. Exposure to cigarette smoke or irritating chemicals will make bronchitis worse. If you smoke and you need help quitting, ask your health care provider. Quitting smoking will help your lungs heal faster.  Use an inhaler, cool mist vaporizer, or humidifier as told by your health care provider.  Keep all follow-up visits as told by your health care provider. This is important. How  is this prevented? To lower your risk of getting this condition again:  Wash your hands often with soap and water. If soap and water are not available, use hand sanitizer.  Avoid contact with people who have cold symptoms.  Try not to touch your hands to your mouth, nose, or eyes.  Make sure to get the flu shot every year.  Contact a health care provider if:  Your symptoms do not  improve in 2 weeks of treatment. Get help right away if:  You cough up blood.  You have chest pain.  You have severe shortness of breath.  You become dehydrated.  You faint or keep feeling like you are going to faint.  You keep vomiting.  You have a severe headache.  Your fever or chills gets worse. This information is not intended to replace advice given to you by your health care provider. Make sure you discuss any questions you have with your health care provider. Document Released: 10/18/2004 Document Revised: 04/04/2016 Document Reviewed: 02/29/2016 Elsevier Interactive Patient Education  2017 Elsevier Inc.  Cough, Adult Coughing is a reflex that clears your throat and your airways. Coughing helps to heal and protect your lungs. It is normal to cough occasionally, but a cough that happens with other symptoms or lasts a long time may be a sign of a condition that needs treatment. A cough may last only 2-3 weeks (acute), or it may last longer than 8 weeks (chronic). What are the causes? Coughing is commonly caused by:  Breathing in substances that irritate your lungs.  A viral or bacterial respiratory infection.  Allergies.  Asthma.  Postnasal drip.  Smoking.  Acid backing up from the stomach into the esophagus (gastroesophageal reflux).  Certain medicines.  Chronic lung problems, including COPD (or rarely, lung cancer).  Other medical conditions such as heart failure.  Follow these instructions at home: Pay attention to any changes in your symptoms. Take these actions to help with your discomfort:  Take medicines only as told by your health care provider. ? If you were prescribed an antibiotic medicine, take it as told by your health care provider. Do not stop taking the antibiotic even if you start to feel better. ? Talk with your health care provider before you take a cough suppressant medicine.  Drink enough fluid to keep your urine clear or pale  yellow.  If the air is dry, use a cold steam vaporizer or humidifier in your bedroom or your home to help loosen secretions.  Avoid anything that causes you to cough at work or at home.  If your cough is worse at night, try sleeping in a semi-upright position.  Avoid cigarette smoke. If you smoke, quit smoking. If you need help quitting, ask your health care provider.  Avoid caffeine.  Avoid alcohol.  Rest as needed.  Contact a health care provider if:  You have new symptoms.  You cough up pus.  Your cough does not get better after 2-3 weeks, or your cough gets worse.  You cannot control your cough with suppressant medicines and you are losing sleep.  You develop pain that is getting worse or pain that is not controlled with pain medicines.  You have a fever.  You have unexplained weight loss.  You have night sweats. Get help right away if:  You cough up blood.  You have difficulty breathing.  Your heartbeat is very fast. This information is not intended to replace advice given to you by your health  care provider. Make sure you discuss any questions you have with your health care provider. Document Released: 03/09/2011 Document Revised: 02/16/2016 Document Reviewed: 11/17/2014 Elsevier Interactive Patient Education  2017 [employer].    IF you received an x-ray today, you will receive an invoice from [facility]. Please contact [facility] at [phone] with questions or concerns regarding your invoice.   IF you received labwork today, you will receive an invoice from [facility]. Please contact [facility] at [phone] with questions or concerns regarding your invoice.   Our billing staff will not be able to assist you with questions regarding bills from these companies.  You will be contacted with the lab results as soon as they are available. The fastest way to get your results is to activate your My Chart account. Instructions are located on  the last page of this paperwork. If you have not heard from [place] regarding the results in 2 weeks, please contact this office.

## 2017-05-06 NOTE — Progress Notes (Signed)
Subjective:  By signing my name below, I, [name], attest that this documentation has been prepared under the direction and in the presence of Meredith Staggers, MD. Electronically Signed: [name], Scribe. 05/06/2017 , 4:36 PM .  Patient was seen in Room 27 .   Patient ID: Lauren Ballard, female    DOB: [birth year], 46 y.o.   MRN: [id number] Chief Complaint  Patient presents with  . Cough    x3 weeks productive green   HPI Lauren Ballard is a 46 y.o. female Here for 3 week history of productive cough.   Patient states she's had this cough for about 3.5 weeks. Prior to this cough, she had 4 weeks of cough with few weeks in between. She was sick and felt malaise. She took dayquil for relief.   She traveled to [place] [facility], as her son's graduation gift vacation. They stayed at an AirBnb with a window unit. She isn't sure if this was the cause or due airplane travel. She describes the coughs sound and feel different. She's been coughing up some green phlegm mostly clear, without any improvement. She denies congestion, rhinorrhea, or drainage down her throat. She denies night sweats, fever, or chills. She denies yeast infections in the past with antibiotics. She denies having sleep issues with the cough at night.   She works as an Network engineer.   Patient Active Problem List   Diagnosis Date Noted  . Depression 10/29/2012   Past Medical History:  Diagnosis Date  . Depression    Past Surgical History:  Procedure Laterality Date  . CHOLECYSTECTOMY    . ECTOPIC PREGNANCY SURGERY     Allergies  Allergen Reactions  . Wellbutrin [Bupropion] Rash  . Amoxicillin   . Penicillins Hives  . Sulfur Rash   Prior to Admission medications   Medication Sig Start Date End Date Taking? Authorizing Provider  ALPRAZolam (XANAX) 0.25 MG tablet Take 1 tablet (0.25 mg total) by mouth daily as needed for sleep. 02/23/17   Ethelda Chick, MD  LORYNA 3-0.02 MG tablet  01/29/17   [provider]  nitrofurantoin, macrocrystal-monohydrate, (MACROBID) 100 MG capsule as needed. 01/28/17   [provider]  venlafaxine XR (EFFEXOR-XR) 37.5 MG 24 hr capsule Take 2-3 capsules (75-112.5 mg total) by mouth daily with breakfast. 02/23/17   Ethelda Chick, MD   Social History   Social History  . Marital status: Married    Spouse name: N/A  . Number of children: N/A  . Years of education: N/A   Occupational History  . Not on file.   Social History Main Topics  . Smoking status: Never Smoker  . Smokeless tobacco: Never Used  . Alcohol use 0.6 oz/week    1 Standard drinks or equivalent per week     Comment: 3 x per week  . Drug use: No  . Sexual activity: Not on file   Other Topics Concern  . Not on file   Social History Narrative   Marital status: divorced; not dating in 2017.      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       Tobacco: none      Alcohol: weekends once per week at most.      Drugs: none  Exercise: 3-5 days per week.     Review of Systems  Constitutional: Negative for chills, diaphoresis, fatigue, fever and unexpected weight change.  HENT: Negative for congestion, postnasal drip, rhinorrhea, sinus pain and sore throat.   Respiratory: Positive for cough. Negative for shortness of breath and wheezing.   Gastrointestinal: Negative for constipation, diarrhea, nausea and vomiting.  Skin: Negative for rash and wound.  Neurological: Negative for dizziness, weakness and headaches.       Objective:   Physical Exam  Constitutional: She is oriented to person, place, and time. She appears well-developed and well-nourished. No distress.  HENT:  Head: Normocephalic and atraumatic.  Right Ear: Hearing, tympanic membrane, external ear and ear canal normal.  Left Ear: Hearing,  tympanic membrane, external ear and ear canal normal.  Nose: Nose normal. Right sinus exhibits no maxillary sinus tenderness and no frontal sinus tenderness. Left sinus exhibits no maxillary sinus tenderness and no frontal sinus tenderness.  Mouth/Throat: Oropharynx is clear and moist. No oropharyngeal exudate.  Eyes: Pupils are equal, round, and reactive to light. Conjunctivae and EOM are normal.  Cardiovascular: Normal rate, regular rhythm, normal heart sounds and intact distal pulses.   No murmur heard. Pulmonary/Chest: Effort normal. No respiratory distress. She has decreased breath sounds. She has no wheezes. She has no rhonchi.  Few faint decreased breath sounds in lower lobes bilaterally  Lymphadenopathy:    She has no cervical adenopathy.  Neurological: She is alert and oriented to person, place, and time.  Skin: Skin is warm and dry. No rash noted.  Psychiatric: She has a normal mood and affect. Her behavior is normal.  Vitals reviewed.   Vitals:   05/06/17 1616  BP: 114/71  Pulse: 65  Resp: 16  Temp: 98.4 F (36.9 C)  TempSrc: Oral  SpO2: 97%  Weight: 117 lb (53.1 kg)  Height: 5' (1.524 m)      Assessment & Plan:   Lauren Ballard is a 46 y.o. female Cough - Plan: azithromycin (ZITHROMAX) 250 MG tablet  LRTI (lower respiratory tract infection) - Plan: azithromycin (ZITHROMAX) 250 MG tablet Likely 2 separate infections, and initially likely viral.  Possible initial viral infection with secondary bronchitis, lower respiratory tract infection with persistent cough.  -Overall reassuring exam, but based on timing recommended initial trial of Mucinex or Mucinex DM, then Z-Pak if not improving. Imaging was deferred, but RTC precautions were discussed.   Meds ordered this encounter  Medications  . azithromycin (ZITHROMAX) 250 MG tablet    Sig: Take 2 pills by mouth on day 1, then 1 pill by mouth per day on days 2 through 5.    Dispense:  6 tablet    Refill:  0    Patient Instructions    Overall your exam is reassuring. Cough may be due to a bronchitis. Try Mucinex or Mucinex DM, then if not improving can start antibiotic as we discussed. If you do run fevers, are short of breath, or worsening, please return for repeat exam and possible chest x-ray.   Acute Bronchitis, Adult Acute bronchitis is sudden (acute) swelling of the air tubes (bronchi) in the lungs. Acute bronchitis causes these tubes to fill with mucus, which can make it hard to breathe. It can also cause coughing or wheezing. In adults, acute bronchitis usually goes away within 2 weeks. A cough caused by bronchitis may last up to 3 weeks. Smoking, allergies, and asthma can make the condition worse. Repeated episodes of bronchitis may cause further lung  problems, such as chronic obstructive pulmonary disease (COPD). What are the causes? This condition can be caused by germs and by substances that irritate the lungs, including:  Cold and flu viruses. This condition is most often caused by the same virus that causes a cold.  Bacteria.  Exposure to tobacco smoke, dust, fumes, and air pollution.  What increases the risk? This condition is more likely to develop in people who:  Have close contact with someone with acute bronchitis.  Are exposed to lung irritants, such as tobacco smoke, dust, fumes, and vapors.  Have a weak immune system.  Have a respiratory condition such as asthma.  What are the signs or symptoms? Symptoms of this condition include:  A cough.  Coughing up clear, yellow, or green mucus.  Wheezing.  Chest congestion.  Shortness of breath.  A fever.  Body aches.  Chills.  A sore throat.  How is this diagnosed? This condition is usually diagnosed with a physical exam. During the exam, your health care provider may order tests, such as chest X-rays, to rule out other conditions. He or she may also:  Test a sample of your mucus for bacterial  infection.  Check the level of oxygen in your blood. This is done to check for pneumonia.  Do a chest X-ray or lung function testing to rule out pneumonia and other conditions.  Perform blood tests.  Your health care provider will also ask about your symptoms and medical history. How is this treated? Most cases of acute bronchitis clear up over time without treatment. Your health care provider may recommend:  Drinking more fluids. Drinking more makes your mucus thinner, which may make it easier to breathe.  Taking a medicine for a fever or cough.  Taking an antibiotic medicine.  Using an inhaler to help improve shortness of breath and to control a cough.  Using a cool mist vaporizer or humidifier to make it easier to breathe.  Follow these instructions at home: Medicines  Take over-the-counter and prescription medicines only as told by your health care provider.  If you were prescribed an antibiotic, take it as told by your health care provider. Do not stop taking the antibiotic even if you start to feel better. General instructions  Get plenty of rest.  Drink enough fluids to keep your urine clear or pale yellow.  Avoid smoking and secondhand smoke. Exposure to cigarette smoke or irritating chemicals will make bronchitis worse. If you smoke and you need help quitting, ask your health care provider. Quitting smoking will help your lungs heal faster.  Use an inhaler, cool mist vaporizer, or humidifier as told by your health care provider.  Keep all follow-up visits as told by your health care provider. This is important. How is this prevented? To lower your risk of getting this condition again:  Wash your hands often with soap and water. If soap and water are not available, use hand sanitizer.  Avoid contact with people who have cold symptoms.  Try not to touch your hands to your mouth, nose, or eyes.  Make sure to get the flu shot every year.  Contact a health care  provider if:  Your symptoms do not improve in 2 weeks of treatment. Get help right away if:  You cough up blood.  You have chest pain.  You have severe shortness of breath.  You become dehydrated.  You faint or keep feeling like you are going to faint.  You keep vomiting.  You have  a severe headache.  Your fever or chills gets worse. This information is not intended to replace advice given to you by your health care provider. Make sure you discuss any questions you have with your health care provider. Document Released: 10/18/2004 Document Revised: 04/04/2016 Document Reviewed: 02/29/2016 Elsevier Interactive Patient Education  2017 Else[employer] Inc.  Cough, Adult Coughing is a reflex that clears your throat and your airways. Coughing helps to heal and protect your lungs. It is normal to cough occasionally, but a cough that happens with other symptoms or lasts a long time may be a sign of a condition that needs treatment. A cough may last only 2-3 weeks (acute), or it may last longer than 8 weeks (chronic). What are the causes? Coughing is commonly caused by:  Breathing in substances that irritate your lungs.  A viral or bacterial respiratory infection.  Allergies.  Asthma.  Postnasal drip.  Smoking.  Acid backing up from the stomach into the esophagus (gastroesophageal reflux).  Certain medicines.  Chronic lung problems, including COPD (or rarely, lung cancer).  Other medical conditions such as heart failure.  Follow these instructions at home: Pay attention to any changes in your symptoms. Take these actions to help with your discomfort:  Take medicines only as told by your health care provider. ? If you were prescribed an antibiotic medicine, take it as told by your health care provider. Do not stop taking the antibiotic even if you start to feel better. ? Talk with your health care provider before you take a cough suppressant medicine.  Drink enough fluid to  keep your urine clear or pale yellow.  If the air is dry, use a cold steam vaporizer or humidifier in your bedroom or your home to help loosen secretions.  Avoid anything that causes you to cough at work or at home.  If your cough is worse at night, try sleeping in a semi-upright position.  Avoid cigarette smoke. If you smoke, quit smoking. If you need help quitting, ask your health care provider.  Avoid caffeine.  Avoid alcohol.  Rest as needed.  Contact a health care provider if:  You have new symptoms.  You cough up pus.  Your cough does not get better after 2-3 weeks, or your cough gets worse.  You cannot control your cough with suppressant medicines and you are losing sleep.  You develop pain that is getting worse or pain that is not controlled with pain medicines.  You have a fever.  You have unexplained weight loss.  You have night sweats. Get help right away if:  You cough up blood.  You have difficulty breathing.  Your heartbeat is very fast. This information is not intended to replace advice given to you by your health care provider. Make sure you discuss any questions you have with your health care provider. Document Released: 03/09/2011 Document Revised: 02/16/2016 Document Reviewed: 11/17/2014 Elsevier Interactive Patient Education  2017 [employer].    IF you received an x-ray today, you will receive an invoice from [facility]. Please contact [facility] at [phone] with questions or concerns regarding your invoice.   IF you received labwork today, you will receive an invoice from [facility]. Please contact [facility] at [phone] with questions or concerns regarding your invoice.   Our billing staff will not be able to assist you with questions regarding bills from these companies.  You will be contacted with the lab results as soon as they are available. The fastest way to get  your results is to activate your My Chart  account. Instructions are located on the last page of this paperwork. If you have not heard from [place] regarding the results in 2 weeks, please contact this office.       I personally performed the services described in this documentation, which was scribed in my presence. The recorded information has been reviewed and considered for accuracy and completeness, addended by me as needed, and agree with information above.  Signed,   Meredith Staggers, MD Primary Care at [facility].  05/08/17 2:53 PM

## 2017-05-10 ENCOUNTER — Telehealth: Payer: Self-pay | Admitting: Family Medicine

## 2017-05-10 DIAGNOSIS — J22 Unspecified acute lower respiratory infection: Secondary | ICD-10-CM

## 2017-05-10 DIAGNOSIS — R05 Cough: Secondary | ICD-10-CM

## 2017-05-10 DIAGNOSIS — R059 Cough, unspecified: Secondary | ICD-10-CM

## 2017-05-10 MED ORDER — AZITHROMYCIN 250 MG PO TABS: ORAL_TABLET | ORAL | 0 refills | Status: DC

## 2017-05-10 NOTE — Telephone Encounter (Signed)
Medication erx'd to pharmacy as requested./ S.Fortune Torosian,CMA

## 2017-05-10 NOTE — Telephone Encounter (Signed)
Patient would like her Z-pak called into the [facility] at [facility] and [address] because she states she left it here after her Appt on 05/03/17 and they put it in the RX pick up drawer for her but she doesn't have enough time to come back and get it with her work schedule.

## 2017-05-23 ENCOUNTER — Telehealth: Payer: Self-pay | Admitting: Family Medicine

## 2017-05-23 NOTE — Telephone Encounter (Signed)
Pt is calling stating that she is not any better and from her last visit and would like to know what to do next   Best number [phone]

## 2017-05-24 NOTE — Telephone Encounter (Signed)
Patient returned my call while I was at lunch.  LMVM stating that I had gotten her VM about not feeling any better, and if she could try and give the abx a couple days to get in her system and start working.  If no better by beginning of next week come back into clinic.

## 2017-05-24 NOTE — Telephone Encounter (Signed)
LMVM advising patient to call back.  I see she was prescribed z-pak at her visit, it could take a few days before she is feeling better.

## 2017-07-02 ENCOUNTER — Ambulatory Visit (INDEPENDENT_AMBULATORY_CARE_PROVIDER_SITE_OTHER): Payer: Managed Care, Other (non HMO) | Admitting: Physician Assistant

## 2017-07-02 ENCOUNTER — Other Ambulatory Visit: Payer: Self-pay | Admitting: *Deleted

## 2017-07-02 ENCOUNTER — Encounter: Payer: Self-pay | Admitting: Physician Assistant

## 2017-07-02 VITALS — BP 135/87 | HR 73 | Temp 98.2°F | Resp 16 | Ht 59.5 in | Wt 116.4 lb

## 2017-07-02 DIAGNOSIS — B852 Pediculosis, unspecified: Secondary | ICD-10-CM

## 2017-07-02 MED ORDER — PERMETHRIN 1 % EX LOTN: 1.0000 "application " | TOPICAL_LOTION | Freq: Once | CUTANEOUS | 0 refills | Status: AC

## 2017-07-02 NOTE — Progress Notes (Signed)
   MARCHEL FOOTE  MRN: [id number] DOB: [birth year]  PCP: Ethelda Chick, MD  Subjective:  Pt is a 46 year old female who presents to clinic for lice. She found several live lice on her daughter and would like to be treated. Daughter has a few friends with confirmed lice. She has not tried anything for this. This is her first encounter with lice.    Review of Systems  Skin: Negative for color change and rash.  Neurological: Negative for dizziness and headaches.  Psychiatric/Behavioral: Negative for sleep disturbance.    Patient Active Problem List   Diagnosis Date Noted  . Depression 10/29/2012    Current Outpatient Prescriptions on File Prior to Visit  Medication Sig Dispense Refill  . ALPRAZolam (XANAX) 0.25 MG tablet Take 1 tablet (0.25 mg total) by mouth daily as needed for sleep. 30 tablet 0  . LORYNA 3-0.02 MG tablet     . venlafaxine XR (EFFEXOR-XR) 37.5 MG 24 hr capsule Take 2-3 capsules (75-112.5 mg total) by mouth daily with breakfast. (Patient not taking: Reported on 07/02/2017) 90 capsule 5   No current facility-administered medications on file prior to visit.     Allergies  Allergen Reactions  . Wellbutrin [Bupropion] Rash  . Amoxicillin   . Penicillins Hives  . Sulfur Rash     Objective:  BP 135/87   Pulse 73   Temp 98.2 F (36.8 C) (Oral)   Resp 16   Ht 4' 11.5" (1.511 m)   Wt 116 lb 6.4 oz (52.8 kg)   LMP 06/03/2017   SpO2 98%   BMI 23.12 kg/m   Physical Exam  Constitutional: She is oriented to person, place, and time and well-developed, well-nourished, and in no distress. No distress.  Cardiovascular: Normal rate, regular rhythm and normal heart sounds.   Neurological: She is alert and oriented to person, place, and time. GCS score is 15.  Skin: Skin is warm and dry.  A few nits seen scattered about scalp. No live lice noted.   Psychiatric: Mood, memory, affect and judgment normal.  Vitals reviewed.   Assessment and Plan :  1. Lice -  permethrin (PERMETHRIN LICE TREATMENT) 1 % lotion; Apply 1 application topically once. Shampoo, rinse and towel dry hair, saturate hair and scalp with permethrin. Rinse after 10 min; repeat in 1 week if needed  Dispense: 59 mL; Refill: 0  - Home care instructions printed out and discussed with pt. Retreat in 9 days. She understands.   Marco Collie, PA-C  Primary Care at [facility] 07/02/2017 3:41 PM

## 2017-07-02 NOTE — Patient Instructions (Addendum)
buy "Fairy tales" (With metal combs.)  detangler spray - "Rosemary Repel" hair spray... This will prevent lice from reinfecting your head if others around you have not had successful treatment.   Permethrin lotion, 1%; Brand name product: Nix*. Permethrin is a synthetic pyrethroid similar to naturally occurring pyrethrins. Permethrin lotion 1% is approved by the FDA for the treatment of head lice. Permethrin is safe and effective when used as directed. Permethrin kills live lice but not unhatched eggs. Permethrin may continue to kill newly hatched lice for several days after treatment. A second treatment often is necessary on day 9 to kill any newly hatched lice before they can produce new eggs   General Guidelines Treatment for head lice is recommended for persons diagnosed with an active infestation. All household members and other close contacts should be checked; those persons with evidence of an active infestation should be treated. Some experts believe prophylactic treatment is prudent for persons who share the same bed with actively-infested individuals. All infested persons (household members and close contacts) and their bedmates should be treated at the same time. Some pediculicides (medicines that kill lice) have an ovicidal effect (kill eggs). For pediculicides that are only weakly ovicidal or not ovicidal, routine retreatment is recommended. For those that are more strongly ovicidal, retreatment is recommended only if live (crawling) lice are still present several days after treatment (see recommendation for each medication). To be most effective, retreatment should occur after all eggs have hatched but before new eggs are produced. When treating head lice, supplemental measures can be combined with recommended medicine (pharmacologic treatment); however, such additional (non-pharmacologic) measures generally are not required to eliminate a head lice infestation. For example, hats, scarves,  pillow cases, bedding, clothing, and towels worn or used by the infested person in the 2-day period just before treatment is started can be machine washed and dried using the hot water and hot air cycles because lice and eggs are killed by exposure for 5 minutes to temperatures greater than 53.5C (128.62F). Items that cannot be laundered may be dry-cleaned or sealed in a plastic bag for two weeks. Items such as hats, grooming aids, and towels that come in contact with the hair of an infested person should not be shared. Vacuuming furniture and floors can remove an infested person's hairs that might have viable nits attached.  Treat the infested person(s): Requires using an Over-the-counter (OTC) or prescription medication. Follow these treatment steps: 1. Before applying treatment, it may be helpful to remove clothing that can become wet or stained during treatment. 2. Apply lice medicine, also called pediculicide, according to the instructions contained in the box or printed on the label. If the infested person has very long hair (longer than shoulder length), it may be necessary to use a second bottle. Pay special attention to instructions on the label or in the box regarding how long the medication should be left on the hair and how it should be washed out. WARNING: Do not use a combination shampoo/conditioner, or conditioner before using lice medicine. Do not re-wash the hair for 1-2 days after the lice medicine is removed. Have the infested person put on clean clothing after treatment.  If a few live lice are still found 8-12 hours after treatment, but are moving more slowly than before, do not retreat. The medicine may take longer to kill all the lice. Comb dead and any remaining live lice out of the hair using a fine-toothed nit comb.  If, after 8-12 hours  of treatment, no dead lice are found and lice seem as active as before, the medicine may not be working. Do not retreat until speaking with your  health care provider; a different pediculicide may be necessary. If your health care provider recommends a different pediculicide, carefully follow the treatment instructions contained in the box or printed on the label.  Nit (head lice egg) combs, often found in lice medicine packages, should be used to comb nits and lice from the hair shaft. Many flea combs made for cats and dogs are also effective.  After each treatment, checking the hair and combing with a nit comb to remove nits and lice every 2-3 days may decrease the chance of self-reinfestation. Continue to check for 2-3 weeks to be sure all lice and nits are gone. Nit removal is not needed when treating with spinosad topical suspension.  Retreatment is meant to kill any surviving hatched lice before they produce new eggs. For some drugs, retreatment is recommended routinely about a week after the first treatment (7-9 days, depending on the drug) and for others only if crawling lice are seen during this period. Retreatment with lindane shampoo is not recommended.   Supplemental Measures: Head lice do not survive long if they fall off a person and cannot feed. You don't need to spend a lot of time or money on housecleaning activities. Follow these steps to help avoid re-infestation by lice that have recently fallen off the hair or crawled onto clothing or furniture. 1. Machine wash and dry clothing, bed linens, and other items that the infested person wore or used during the 2 days before treatment using the hot water (130F) laundry cycle and the high heat drying cycle. Clothing and items that are not washable can be dry-cleaned OR sealed in a plastic bag and stored for 2 weeks. 2. Soak combs and brushes in hot water (at least 130F) for 5-10 minutes. 3. Vacuum the floor and furniture, particularly where the infested person sat or lay. However, the risk of getting infested by a louse that has fallen onto a rug or carpet or furniture is very small.  Head lice survive less than 1-2 days if they fall off a person and cannot feed; nits cannot hatch and usually die within a week if they are not kept at the same temperature as that found close to the human scalp. Spending much time and money on housecleaning activities is not necessary to avoid reinfestation by lice or nits that may have fallen off the head or crawled onto furniture or clothing. 4. Do not use fumigant sprays; they can be toxic if inhaled or absorbed through the skin.   IF you received an x-ray today, you will receive an invoice from [facility]. Please contact [facility] at [phone] with questions or concerns regarding your invoice.   IF you received labwork today, you will receive an invoice from [facility]. Please contact [facility] at [phone] with questions or concerns regarding your invoice.   Our billing staff will not be able to assist you with questions regarding bills from these companies.  You will be contacted with the lab results as soon as they are available. The fastest way to get your results is to activate your My Chart account. Instructions are located on the last page of this paperwork. If you have not heard from [place] regarding the results in 2 weeks, please contact this office.

## 2017-07-03 ENCOUNTER — Telehealth: Payer: Self-pay | Admitting: Physician Assistant

## 2017-07-03 NOTE — Telephone Encounter (Signed)
Pt brought her daughter in to be seen for lice and she asked to be prescribed the same thing for herself.  She was told she had to have an OV to be prescribed.  So she waited for her visit and they went to the pharmacy.  The pharmacy said the prescription was an over-the-counter medication.  So she wants to know why she wasn't just told that and why she had to have the appointment in the first place.  Also the friend her daughter caught this from said she was prescribed an 8-hour treatment.  What we gave them was something to leave on for just ten minutes.  Is there something different and better we could prescribe?  Please call asap.  [phone]

## 2017-07-04 ENCOUNTER — Telehealth: Payer: Self-pay | Admitting: Family Medicine

## 2017-07-04 NOTE — Telephone Encounter (Signed)
Left vm to let pt know her appt time for oct 18 has changed to 9:00 am instead of 8:20am

## 2017-07-04 NOTE — Telephone Encounter (Signed)
Advised 

## 2017-07-11 ENCOUNTER — Ambulatory Visit (INDEPENDENT_AMBULATORY_CARE_PROVIDER_SITE_OTHER): Payer: Managed Care, Other (non HMO) | Admitting: Family Medicine

## 2017-07-11 ENCOUNTER — Encounter: Payer: Self-pay | Admitting: Family Medicine

## 2017-07-11 VITALS — BP 110/72 | HR 73 | Temp 98.0°F | Resp 16 | Ht 60.63 in | Wt 116.0 lb

## 2017-07-11 DIAGNOSIS — Z114 Encounter for screening for human immunodeficiency virus [HIV]: Secondary | ICD-10-CM | POA: Diagnosis not present

## 2017-07-11 DIAGNOSIS — Z0000 Encounter for general adult medical examination without abnormal findings: Secondary | ICD-10-CM

## 2017-07-11 DIAGNOSIS — Z13220 Encounter for screening for lipoid disorders: Secondary | ICD-10-CM | POA: Diagnosis not present

## 2017-07-11 DIAGNOSIS — Z131 Encounter for screening for diabetes mellitus: Secondary | ICD-10-CM | POA: Diagnosis not present

## 2017-07-11 DIAGNOSIS — F419 Anxiety disorder, unspecified: Secondary | ICD-10-CM | POA: Diagnosis not present

## 2017-07-11 DIAGNOSIS — F329 Major depressive disorder, single episode, unspecified: Secondary | ICD-10-CM | POA: Diagnosis not present

## 2017-07-11 DIAGNOSIS — F32A Depression, unspecified: Secondary | ICD-10-CM

## 2017-07-11 MED ORDER — VENLAFAXINE HCL ER 37.5 MG PO CP24: 75.0000 mg | ORAL_CAPSULE | Freq: Every day | ORAL | 5 refills | Status: DC

## 2017-07-11 NOTE — Progress Notes (Signed)
Subjective:    Patient ID: Lauren Ballard, female    DOB: 1971/02/12, 46 y.o.   MRN: [id number]  07/11/2017  Annual Exam    HPI This 46 y.o. female presents for Complete Physical Examination.  Last physical:  04/2016 Pap smear:  04/2016  Taavon; next month; regular menses. Mammogram:  04/2016 Ta[facility]on; scheduled. Eye exam:  Every year; contact. Dental exam:  Every six months   Takes GABA as needed for anxiety and depression; gets at [employer].  Uses GABA rarely. Around menses, everything intensifies.  GABA seems to help some.    Visual Acuity Screening   Right eye Left eye Both eyes  Without correction:     With correction: 20/20 20/20 20/20     BP Readings from Last 3 Encounters:  07/11/17 110/72  07/02/17 135/87  05/06/17 114/71   Wt Readings from Last 3 Encounters:  07/11/17 116 lb (52.6 kg)  07/02/17 116 lb 6.4 oz (52.8 kg)  05/06/17 117 lb (53.1 kg)   Immunization History  Administered Date(s) Administered  . Tdap 09/24/2013    Review of Systems  Constitutional: Negative for activity change, appetite change, chills, diaphoresis, fatigue, fever and unexpected weight change.  HENT: Negative for congestion, dental problem, drooling, ear discharge, ear pain, facial swelling, hearing loss, mouth sores, nosebleeds, postnasal drip, rhinorrhea, sinus pressure, sneezing, sore throat, tinnitus, trouble swallowing and voice change.   Eyes: Negative for photophobia, pain, discharge, redness, itching and visual disturbance.  Respiratory: Negative for apnea, cough, choking, chest tightness, shortness of breath, wheezing and stridor.   Cardiovascular: Negative for chest pain, palpitations and leg swelling.  Gastrointestinal: Negative for abdominal distention, abdominal pain, anal bleeding, blood in stool, constipation, diarrhea, nausea, rectal pain and vomiting.  Endocrine: Negative for cold intolerance, heat intolerance, polydipsia, polyphagia and polyuria.  Genitourinary:  Negative for decreased urine volume, difficulty urinating, dyspareunia, dysuria, enuresis, flank pain, frequency, genital sores, hematuria, menstrual problem, pelvic pain, urgency, vaginal bleeding, vaginal discharge and vaginal pain.       No nocturia.  No urinary leakage.  Musculoskeletal: Negative for arthralgias, back pain, gait problem, joint swelling, myalgias, neck pain and neck stiffness.  Skin: Negative for color change, pallor, rash and wound.  Allergic/Immunologic: Negative for environmental allergies, food allergies and immunocompromised state.  Neurological: Negative for dizziness, tremors, seizures, syncope, facial asymmetry, speech difficulty, weakness, light-headedness, numbness and headaches.  Hematological: Negative for adenopathy. Does not bruise/bleed easily.  Psychiatric/Behavioral: Positive for decreased concentration, dysphoric mood and sleep disturbance. Negative for agitation, behavioral problems, confusion, hallucinations, self-injury and suicidal ideas. The patient is not nervous/anxious and is not hyperactive.        Bedtime 9:30; wakes up at 6:30am.    Past Medical History:  Diagnosis Date  . Depression    Past Surgical History:  Procedure Laterality Date  . CHOLECYSTECTOMY    . ECTOPIC PREGNANCY SURGERY     Allergies  Allergen Reactions  . Wellbutrin [Bupropion] Rash  . Amoxicillin   . Penicillins Hives  . Sulfur Rash   Current Outpatient Prescriptions on File Prior to Visit  Medication Sig Dispense Refill  . ALPRAZolam (XANAX) 0.25 MG tablet Take 1 tablet (0.25 mg total) by mouth daily as needed for sleep. 30 tablet 0  . LORYNA 3-0.02 MG tablet      No current facility-administered medications on file prior to visit.    Social History   Social History  . Marital status: Divorced    Spouse name: N/A  . Number of  children: 2  . Years of education: N/A   Occupational History  . Social research officer, government    Social History Main Topics  . Smoking status:  Never Smoker  . Smokeless tobacco: Never Used  . Alcohol use 0.6 oz/week    1 Standard drinks or equivalent per week     Comment: 3 x per week  . Drug use: No  . Sexual activity: Not on file   Other Topics Concern  . Not on file   Social History Narrative   Marital status: divorced; not dating in 2018.      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       Tobacco: none      Alcohol: weekends once per week at most.      Drugs: none      Exercise: 3-5 days per week.   Four times per week in 2018; group classes; muscles class, step class.      Seatbelt: 100%; rare texting while driving at stop lights.   Family History  Problem Relation Age of Onset  . Cancer Mother 77       breast cancer  . Depression Mother   . Dementia Mother   . Cancer Maternal Grandfather   . Cancer Paternal Grandmother   . Depression Father        Objective:    BP 110/72   Pulse 73   Temp 98 F (36.7 C) (Oral)   Resp 16   Ht 5' 0.63" (1.54 m)   Wt 116 lb (52.6 kg)   LMP 07/11/2017   SpO2 99%   BMI 22.19 kg/m  Physical Exam  Constitutional: She is oriented to person, place, and time. She appears well-developed and well-nourished. No distress.  HENT:  Head: Normocephalic and atraumatic.  Right Ear: External ear normal.  Left Ear: External ear normal.  Nose: Nose normal.  Mouth/Throat: Oropharynx is clear and moist.  Eyes: Pupils are equal, round, and reactive to light. Conjunctivae and EOM are normal.  Neck: Normal range of motion and full passive range of motion without pain. Neck supple. No JVD present. Carotid bruit is not present. No thyromegaly present.  Cardiovascular: Normal rate, regular rhythm and normal heart sounds.  Exam reveals no gallop and no friction rub.   No  murmur heard. Pulmonary/Chest: Effort normal and breath sounds normal. She has no wheezes. She has no rales.  Abdominal: Soft. Bowel sounds are normal. She exhibits no distension and no mass. There is no tenderness. There is no rebound and no guarding.  Musculoskeletal:       Right shoulder: Normal.       Left shoulder: Normal.       Cervical back: Normal.  Lymphadenopathy:    She has no cervical adenopathy.  Neurological: She is alert and oriented to person, place, and time. She has normal reflexes. No cranial nerve deficit. She exhibits normal muscle tone. Coordination normal.  Skin: Skin is warm and dry. No rash noted. She is not diaphoretic. No erythema. No pallor.  Psychiatric: She has a normal mood and affect. Her behavior is normal. Judgment and thought content normal.  Nursing note and vitals reviewed.  No results found. Depression screen [facility] 2/9 07/11/2017 07/02/2017 05/06/2017  02/23/2017 05/23/2016  Decreased Interest 0 0 0 0 0  Down, Depressed, Hopeless 0 0 0 1 0  PHQ - 2 Score 0 0 0 1 0  Altered sleeping - - - 0 -  Tired, decreased energy - - - 0 -  Change in appetite - - - 0 -  Feeling bad or failure about yourself  - - - 0 -  Trouble concentrating - - - 0 -  Moving slowly or fidgety/restless - - - 1 -  Suicidal thoughts - - - 0 -  PHQ-9 Score - - - 2 -   Fall Risk  07/11/2017 07/02/2017 05/06/2017 02/23/2017 05/23/2016  Falls in the past year? No No No No No        Assessment & Plan:   1. Routine physical examination   2. Screening, lipid   3. Screening for diabetes mellitus   4. Screening for HIV (human immunodeficiency virus)   5. Anxiety and depression    -anticipatory guidance provided --- exercise, weight maintenance, safe driving practices, calcium 600mg  twice daily. -obtain age appropriate screening labs and labs for chronic disease management. -ongoing struggles with depression and anxiety; refill of Effexor 37.5mg  and encourage increasing to three daily; also  continue with counseling and daily exercise for depression management.   -gynecological care with Taavon.  Orders Placed This Encounter  Procedures  . CBC with Differential/Platelet  . Comprehensive metabolic panel    Order Specific Question:   Has the patient fasted?    Answer:   No  . Hemoglobin A1c  . HIV antibody  . TSH  . Lipid panel    Order Specific Question:   Has the patient fasted?    Answer:   No  . POCT urinalysis dipstick   Meds ordered this encounter  Medications  . venlafaxine XR (EFFEXOR-XR) 37.5 MG 24 hr capsule    Sig: Take 2-3 capsules (75-112.5 mg total) by mouth daily with breakfast.    Dispense:  90 capsule    Refill:  5    No Follow-up on file.   Liem Copenhaver Paulita FujitaMartin Kamille Toomey, M.D. [facility]  [place] [facility] [address] [place], [place]  [id number] [phone] phone [phone]) [phone] fax

## 2017-07-11 NOTE — Patient Instructions (Addendum)
IF you received an x-ray today, you will receive an invoice from [facility] Rad[facility]. Please contact [facility] at [phone] with questions or concerns regarding your invoice.   IF you received labwork today, you will receive an invoice from [facility]. Please contact [facility] at [phone] with questions or concerns regarding your invoice.   Our billing staff will not be able to assist you with questions regarding bills from these companies.  You will be contacted with the lab results as soon as they are available. The fastest way to get your results is to activate your My Chart account. Instructions are located on the last page of this paperwork. If you have not heard from [place] regarding the results in 2 weeks, please contact this office.      Preventive Care 40-64 Years, Female Preventive care refers to lifestyle choices and visits with your health care provider that can promote health and wellness. What does preventive care include?  A yearly physical exam. This is also called an annual well check.  Dental exams once or twice a year.  Routine eye exams. Ask your health care provider how often you should have your eyes checked.  Personal lifestyle choices, including: ? Daily care of your teeth and gums. ? Regular physical activity. ? Eating a healthy diet. ? Avoiding tobacco and drug use. ? Limiting alcohol use. ? Practicing safe sex. ? Taking low-dose aspirin daily starting at age 34. ? Taking vitamin and mineral supplements as recommended by your health care provider. What happens during an annual well check? The services and screenings done by your health care provider during your annual well check will depend on your age, overall health, lifestyle risk factors, and family history of disease. Counseling Your health care provider may ask you questions about your:  Alcohol use.  Tobacco use.  Drug use.  Emotional well-being.  Home and relationship  well-being.  Sexual activity.  Eating habits.  Work and work Statistician.  Method of birth control.  Menstrual cycle.  Pregnancy history.  Screening You may have the following tests or measurements:  Height, weight, and BMI.  Blood pressure.  Lipid and cholesterol levels. These may be checked every 5 years, or more frequently if you are over 65 years old.  Skin check.  Lung cancer screening. You may have this screening every year starting at age 102 if you have a 30-pack-year history of smoking and currently smoke or have quit within the past 15 years.  Fecal occult blood test (FOBT) of the stool. You may have this test every year starting at age 33.  Flexible sigmoidoscopy or colonoscopy. You may have a sigmoidoscopy every 5 years or a colonoscopy every 10 years starting at age 23.  Hepatitis C blood test.  Hepatitis B blood test.  Sexually transmitted disease (STD) testing.  Diabetes screening. This is done by checking your blood sugar (glucose) after you have not eaten for a while (fasting). You may have this done every 1-3 years.  Mammogram. This may be done every 1-2 years. Talk to your health care provider about when you should start having regular mammograms. This may depend on whether you have a family history of breast cancer.  BRCA-related cancer screening. This may be done if you have a family history of breast, ovarian, tubal, or peritoneal cancers.  Pelvic exam and Pap test. This may be done every 3 years starting at age 3. Starting at age 55, this may be done every 5 years if  you have a Pap test in combination with an HPV test.  Bone density scan. This is done to screen for osteoporosis. You may have this scan if you are at high risk for osteoporosis.  Discuss your test results, treatment options, and if necessary, the need for more tests with your health care provider. Vaccines Your health care provider may recommend certain vaccines, such  as:  Influenza vaccine. This is recommended every year.  Tetanus, diphtheria, and acellular pertussis (Tdap, Td) vaccine. You may need a Td booster every 10 years.  Varicella vaccine. You may need this if you have not been vaccinated.  Zoster vaccine. You may need this after age 70.  Measles, mumps, and rubella (MMR) vaccine. You may need at least one dose of MMR if you were born in 1957 or later. You may also need a second dose.  Pneumococcal 13-valent conjugate (PCV13) vaccine. You may need this if you have certain conditions and were not previously vaccinated.  Pneumococcal polysaccharide (PPSV23) vaccine. You may need one or two doses if you smoke cigarettes or if you have certain conditions.  Meningococcal vaccine. You may need this if you have certain conditions.  Hepatitis A vaccine. You may need this if you have certain conditions or if you travel or work in places where you may be exposed to hepatitis A.  Hepatitis B vaccine. You may need this if you have certain conditions or if you travel or work in places where you may be exposed to hepatitis B.  Haemophilus influenzae type b (Hib) vaccine. You may need this if you have certain conditions.  Talk to your health care provider about which screenings and vaccines you need and how often you need them. This information is not intended to replace advice given to you by your health care provider. Make sure you discuss any questions you have with your health care provider. Document Released: 10/07/2015 Document Revised: 05/30/2016 Document Reviewed: 07/12/2015 Elsevier Interactive Patient Education  2017 [employer].

## 2017-07-12 ENCOUNTER — Encounter: Payer: Self-pay | Admitting: Family Medicine

## 2017-07-12 LAB — COMPREHENSIVE METABOLIC PANEL
A/G RATIO: 2 (ref 1.2–2.2)
ALBUMIN: 4.6 g/dL (ref 3.5–5.5)
ALK PHOS: 49 IU/L (ref 39–117)
ALT: 9 IU/L (ref 0–32)
AST: 18 IU/L (ref 0–40)
BUN / CREAT RATIO: 15 (ref 9–23)
BUN: 13 mg/dL (ref 6–24)
Bilirubin Total: 0.3 mg/dL (ref 0.0–1.2)
CO2: 25 mmol/L (ref 20–29)
Calcium: 9.3 mg/dL (ref 8.7–10.2)
Chloride: 103 mmol/L (ref 96–106)
Creatinine, Ser: 0.86 mg/dL (ref 0.57–1.00)
GFR calc Af Amer: 94 mL/min/{1.73_m2} (ref >59)
GFR calc non Af Amer: 81 mL/min/{1.73_m2} (ref >59)
GLUCOSE: 82 mg/dL (ref 65–99)
Globulin, Total: 2.3 g/dL (ref 1.5–4.5)
POTASSIUM: 4.3 mmol/L (ref 3.5–5.2)
Sodium: 140 mmol/L (ref 134–144)
Total Protein: 6.9 g/dL (ref 6.0–8.5)

## 2017-07-12 LAB — LIPID PANEL
CHOLESTEROL TOTAL: 204 mg/dL — AB (ref 100–199)
Chol/HDL Ratio: 2.9 ratio (ref 0.0–4.4)
HDL: 70 mg/dL (ref >39)
LDL Calculated: 119 mg/dL — ABNORMAL HIGH (ref 0–99)
Triglycerides: 73 mg/dL (ref 0–149)
VLDL Cholesterol Cal: 15 mg/dL (ref 5–40)

## 2017-07-12 LAB — CBC WITH DIFFERENTIAL/PLATELET
BASOS ABS: 0 10*3/uL (ref 0.0–0.2)
Basos: 1 %
EOS (ABSOLUTE): 0.1 10*3/uL (ref 0.0–0.4)
Eos: 3 %
Hematocrit: 41.4 % (ref 34.0–46.6)
Hemoglobin: 14.1 g/dL (ref 11.1–15.9)
Immature Grans (Abs): 0 10*3/uL (ref 0.0–0.1)
Immature Granulocytes: 0 %
LYMPHS ABS: 1.9 10*3/uL (ref 0.7–3.1)
Lymphs: 43 %
MCH: 30.2 pg (ref 26.6–33.0)
MCHC: 34.1 g/dL (ref 31.5–35.7)
MCV: 89 fL (ref 79–97)
Monocytes Absolute: 0.2 10*3/uL (ref 0.1–0.9)
Monocytes: 5 %
NEUTROS ABS: 2.1 10*3/uL (ref 1.4–7.0)
Neutrophils: 48 %
PLATELETS: 273 10*3/uL (ref 150–379)
RBC: 4.67 x10E6/uL (ref 3.77–5.28)
RDW: 13.3 % (ref 12.3–15.4)
WBC: 4.4 10*3/uL (ref 3.4–10.8)

## 2017-07-12 LAB — TSH: TSH: 1.4 u[IU]/mL (ref 0.450–4.500)

## 2017-07-12 LAB — HEMOGLOBIN A1C
Est. average glucose Bld gHb Est-mCnc: 94 mg/dL
HEMOGLOBIN A1C: 4.9 % (ref 4.8–5.6)

## 2017-07-12 LAB — HIV ANTIBODY (ROUTINE TESTING W REFLEX): HIV Screen 4th Generation wRfx: NONREACTIVE

## 2017-07-17 ENCOUNTER — Telehealth: Payer: Self-pay | Admitting: Family Medicine

## 2017-07-17 NOTE — Telephone Encounter (Signed)
PATIENT STATES DR. Katrinka BlazingSMITH DID HER COMPLETE PHYSICAL LAST THURSDAY (07/11/17). SHE WOULD LIKE HER LAB RESULTS TO BE RELEASED INTO MY-CHART SO THAT SHE CAN SEE THEM. BEST PHONE [phone] (CELL) PHARMACY CHOICE IF NEEDED IS [facility] ON [address]. MBC

## 2017-07-19 NOTE — Telephone Encounter (Signed)
Labs were released.

## 2018-02-13 ENCOUNTER — Encounter: Payer: Self-pay | Admitting: Family Medicine

## 2018-07-15 ENCOUNTER — Encounter: Payer: Self-pay | Admitting: Physician Assistant

## 2018-07-15 ENCOUNTER — Ambulatory Visit (INDEPENDENT_AMBULATORY_CARE_PROVIDER_SITE_OTHER): Payer: Managed Care, Other (non HMO) | Admitting: Physician Assistant

## 2018-07-15 ENCOUNTER — Other Ambulatory Visit: Payer: Self-pay

## 2018-07-15 VITALS — BP 147/81 | HR 64 | Temp 97.8°F | Resp 18 | Ht 60.24 in | Wt 123.6 lb

## 2018-07-15 DIAGNOSIS — Z130 Encounter for screening for diseases of the blood and blood-forming organs and certain disorders involving the immune mechanism: Secondary | ICD-10-CM | POA: Diagnosis not present

## 2018-07-15 DIAGNOSIS — Z0000 Encounter for general adult medical examination without abnormal findings: Secondary | ICD-10-CM | POA: Diagnosis not present

## 2018-07-15 DIAGNOSIS — F419 Anxiety disorder, unspecified: Secondary | ICD-10-CM

## 2018-07-15 DIAGNOSIS — Z13228 Encounter for screening for other metabolic disorders: Secondary | ICD-10-CM | POA: Diagnosis not present

## 2018-07-15 DIAGNOSIS — Z13220 Encounter for screening for lipoid disorders: Secondary | ICD-10-CM | POA: Diagnosis not present

## 2018-07-15 DIAGNOSIS — Z1389 Encounter for screening for other disorder: Secondary | ICD-10-CM

## 2018-07-15 DIAGNOSIS — Z1329 Encounter for screening for other suspected endocrine disorder: Secondary | ICD-10-CM

## 2018-07-15 LAB — URINALYSIS, DIPSTICK ONLY
Bilirubin, UA: NEGATIVE
Glucose, UA: NEGATIVE
Ketones, UA: NEGATIVE
Leukocytes, UA: NEGATIVE
Nitrite, UA: NEGATIVE
PH UA: 6 (ref 5.0–7.5)
PROTEIN UA: NEGATIVE
RBC, UA: NEGATIVE
Specific Gravity, UA: <=1.005 — AB (ref 1.005–1.030)
Urobilinogen, Ur: 0.2 mg/dL (ref 0.2–1.0)

## 2018-07-15 MED ORDER — ALPRAZOLAM 0.25 MG PO TABS: 0.2500 mg | ORAL_TABLET | Freq: Every day | ORAL | 0 refills | Status: AC | PRN

## 2018-07-15 NOTE — Progress Notes (Signed)
Lauren Ballard  MRN: [id number] DOB: 10-Mar-1971  Subjective:  Pt is a 47 y.o. female who presents for annual physical exam. She is fasting today.    Primary Preventative Screenings: Cervical Cancer: 04/2017, sees gyn  Family Planning: Still having menses, they are regular, every month STI screening: Not currently sexually active, no concern for STD Breast Cancer: 2019, normal. FH of breast cancer in mother at age 35 Colorectal Cancer: N/A, age 43, no FH ov colon cancer. Tobacco use/EtOH/substances: Drinks a couple days per week, no illicit drug use, no tobacco abuse. Bone Density: N/A Weight/Blood sugar/Diet/Exercise: Works out 5-6 days a week (cardio/aerobic/boot camp) ~1 hr each time. Eats well balanced meal; wants to lose 8 lbs. Feels bloated after eating bread.  OTC/Vit/Supp/Herbal:  Dentist/Optho:  Dentist every 6 months, Eye doctor frequently due to optic neuritis.  Immunizations: Influenza: Never Tdap: 2015   Takes effexor 33m daily. Uses xanax 0.25-0.5 tablet infrequently once a month for increased moments of anxiety. Also uses GABA as needed for anxiety and depression; gets at [employer]  This seems to help some. Both kids in college, this has been a hard transition for her because she misses them.    Patient Active Problem List   Diagnosis Date Noted  . Depression 10/29/2012    Current Outpatient Medications on File Prior to Visit  Medication Sig Dispense Refill  . venlafaxine XR (EFFEXOR-XR) 37.5 MG 24 hr capsule Take 2-3 capsules (75-112.5 mg total) by mouth daily with breakfast. 90 capsule 5   No current facility-administered medications on file prior to visit.     Allergies  Allergen Reactions  . Wellbutrin [Bupropion] Rash  . Amoxicillin   . Penicillins Hives  . Sulfur Rash    Social History   Socioeconomic History  . Marital status: Divorced    Spouse name: Not on file  . Number of children: 2  . Years of education: Not on file  . Highest  education level: Not on file  Occupational History  . Occupation: IEngineer, maintenance (IT) . Financial resource strain: Not hard at all  . Food insecurity:    Worry: Never true    Inability: Never true  . Transportation needs:    Medical: No    Non-medical: No  Tobacco Use  . Smoking status: Never Smoker  . Smokeless tobacco: Never Used  Substance and Sexual Activity  . Alcohol use: Yes    Alcohol/week: 1.0 standard drinks    Types: 1 Standard drinks or equivalent per week    Comment: 3 x per week  . Drug use: No  . Sexual activity: Not Currently  Lifestyle  . Physical activity:    Days per week: 6 days    Minutes per session: 60 min  . Stress: Not on file  Relationships  . Social connections:    Talks on phone: Not on file    Gets together: Once a week    Attends religious service: Never    Active member of club or organization: No    Attends meetings of clubs or organizations: Never    Relationship status: Divorced  Other Topics Concern  . 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      Seatbelt: 100%; rare texting while driving at stop lights.    Past Surgical History:  Procedure Laterality Date  . CHOLECYSTECTOMY    . ECTOPIC PREGNANCY SURGERY      Family History  Problem Relation Age of Onset  . Cancer Mother 18       breast cancer  . Depression Mother   . Dementia Mother   . Cancer Maternal Grandfather   . Cancer Paternal Grandmother   . Depression Father     Review of Systems  Constitutional: Negative for activity change, appetite change, chills, diaphoresis, fatigue, fever and unexpected weight change.  HENT: Negative for congestion, dental problem, drooling, ear discharge,  ear pain, facial swelling, hearing loss, mouth sores, nosebleeds, postnasal drip, rhinorrhea, sinus pressure, sinus pain, sneezing, sore throat, tinnitus, trouble swallowing and voice change.   Eyes: Positive for visual disturbance. Negative for photophobia, pain, discharge, redness and itching.  Respiratory: Negative for apnea, cough, choking, chest tightness, shortness of breath, wheezing and stridor.   Cardiovascular: Negative for chest pain, palpitations and leg swelling.  Gastrointestinal: Negative for abdominal distention, abdominal pain, anal bleeding, blood in stool, constipation, diarrhea, nausea, rectal pain and vomiting.  Endocrine: Negative for cold intolerance, heat intolerance, polydipsia, polyphagia and polyuria.  Genitourinary: Negative for decreased urine volume, difficulty urinating, dyspareunia, dysuria, enuresis, flank pain, frequency, genital sores, hematuria, menstrual problem, pelvic pain, urgency, vaginal bleeding, vaginal discharge and vaginal pain.  Musculoskeletal: Negative for arthralgias, back pain, gait problem, joint swelling, myalgias, neck pain and neck stiffness.  Skin: Negative for color change, pallor, rash and wound.  Allergic/Immunologic: Negative for environmental allergies, food allergies and immunocompromised state.  Neurological: Negative for dizziness, tremors, seizures, syncope, facial asymmetry, speech difficulty, weakness, light-headedness, numbness and headaches.  Hematological: Negative for adenopathy. Does not bruise/bleed easily.  Psychiatric/Behavioral: Negative for agitation, behavioral problems, confusion, decreased concentration, dysphoric mood, hallucinations, self-injury, sleep disturbance and suicidal ideas. The patient is not nervous/anxious and is not hyperactive.     Objective:  BP (!) 147/81   Pulse 64   Temp 97.8 F (36.6 C) (Oral)   Resp 18   Ht 5' 0.24" (1.53 m)   Wt 123 lb 9.6 oz (56.1 kg)   LMP 06/24/2018   SpO2 100%   BMI  23.95 kg/m   Physical Exam  Constitutional: She is oriented to person, place, and time. She appears well-developed and well-nourished. No distress.  HENT:  Head: Normocephalic and atraumatic.  Right Ear: Hearing, tympanic membrane, external ear and ear canal normal.  Left Ear: Hearing, tympanic membrane, external ear and ear canal normal.  Nose: Nose normal.  Mouth/Throat: Uvula is midline, oropharynx is clear and moist and mucous membranes are normal. No oropharyngeal exudate.  Eyes: Pupils are equal, round, and reactive to light. Conjunctivae, EOM and lids are normal. No scleral icterus.  Neck: Trachea normal and normal range of motion. No thyroid mass and no thyromegaly present.  Cardiovascular: Normal rate, regular rhythm, normal heart sounds and intact distal pulses.  Pulmonary/Chest: Effort normal and breath sounds normal. Right breast exhibits no inverted nipple, no mass, no nipple discharge, no skin change and no tenderness. Left breast exhibits no inverted nipple, no mass, no nipple discharge, no skin change and no tenderness. Breasts are symmetrical.  CMA chaperone present for breast exam.  Abdominal: Soft. Normal appearance and bowel sounds are normal. There is no tenderness.  Musculoskeletal: Normal range of motion.  Lymphadenopathy:       Head (right side): No tonsillar, no preauricular, no posterior auricular and no occipital adenopathy present.       Head (left side): No tonsillar, no preauricular, no posterior auricular and no occipital adenopathy present.    She has no cervical adenopathy.       Right: No supraclavicular adenopathy present.       Left: No supraclavicular adenopathy present.  Neurological: She is alert and oriented to person, place, and time. She has normal strength and normal reflexes.  Skin: Skin is warm and dry.   Vision Screening Comments: Couldn't do   Assessment and Plan :  Discussed healthy lifestyle, diet, exercise, preventative care,  vaccinations, and addressed patient's concerns. Plan for follow up in one year. Otherwise, plan for specific conditions below.  1. Annual physical exam Await lab results.   2. Screening for deficiency anemia - CBC with Differential/Platelet  3. Screening for metabolic disorder - QPY19+JKDT  4. Screening cholesterol level - Lipid panel  5. Screening for thyroid disorder - TSH  6. Screening for hematuria or proteinuria - Urinalysis, dipstick only  7. Anxiety Stable on medication regimen. Does not need refills for effexor at this time. Will contact office when she does. Refill provided for xanax. Follow up as needed.  - ALPRAZolam (XANAX) 0.25 MG tablet; Take 1 tablet (0.25 mg total) by mouth daily as needed for sleep.  Dispense: 30 tablet; Refill: 0   Tenna Delaine, PA-C  [facility] 07/15/2018 10:05 AM

## 2018-07-15 NOTE — Patient Instructions (Addendum)
If you have lab work done today you will be contacted with your lab results within the next 2 weeks.  If you have not heard from [place] then please contact us. The fastest way to get your results is to register for My Chart.   Health Maintenance, Female Adopting a healthy lifestyle and getting preventive care can go a long way to promote health and wellness. Talk with your health care provider about what schedule of regular examinations is right for you. This is a good chance for you to check in with your provider about disease prevention and staying healthy. In between checkups, there are plenty of things you can do on your own. Experts have done a lot of research about which lifestyle changes and preventive measures are most likely to keep you healthy. Ask your health care provider for more information. Weight and diet Eat a healthy diet  Be sure to include plenty of vegetables, fruits, low-fat dairy products, and lean protein.  Do not eat a lot of foods high in solid fats, added sugars, or salt.  Get regular exercise. This is one of the most important things you can do for your health. ? Most adults should exercise for at least 150 minutes each week. The exercise should increase your heart rate and make you sweat (moderate-intensity exercise). ? Most adults should also do strengthening exercises at least twice a week. This is in addition to the moderate-intensity exercise.  Maintain a healthy weight  Body mass index (BMI) is a measurement that can be used to identify possible weight problems. It estimates body fat based on height and weight. Your health care provider can help determine your BMI and help you achieve or maintain a healthy weight.  For females 25 years of age and older: ? A BMI below 18.5 is considered underweight. ? A BMI of 18.5 to 24.9 is normal. ? A BMI of 25 to 29.9 is considered overweight. ? A BMI of 30 and above is considered obese.  Watch levels of cholesterol and  blood lipids  You should start having your blood tested for lipids and cholesterol at 47 years of age, then have this test every 5 years.  You may need to have your cholesterol levels checked more often if: ? Your lipid or cholesterol levels are high. ? You are older than 47 years of age. ? You are at high risk for heart disease.  Cancer screening Lung Cancer  Lung cancer screening is recommended for adults 14-57 years old who are at high risk for lung cancer because of a history of smoking.  A yearly low-dose CT scan of the lungs is recommended for people who: ? Currently smoke. ? Have quit within the past 15 years. ? Have at least a 30-pack-year history of smoking. A pack year is smoking an average of one pack of cigarettes a day for 1 year.  Yearly screening should continue until it has been 15 years since you quit.  Yearly screening should stop if you develop a health problem that would prevent you from having lung cancer treatment.  Breast Cancer  Practice breast self-awareness. This means understanding how your breasts normally appear and feel.  It also means doing regular breast self-exams. Let your health care provider know about any changes, no matter how small.  If you are in your 20s or 30s, you should have a clinical breast exam (CBE) by a health care provider every 1-3 years as part of a regular health  exam.  If you are 40 or older, have a CBE every year. Also consider having a breast X-ray (mammogram) every year.  If you have a family history of breast cancer, talk to your health care provider about genetic screening.  If you are at high risk for breast cancer, talk to your health care provider about having an MRI and a mammogram every year.  Breast cancer gene (BRCA) assessment is recommended for women who have family members with BRCA-related cancers. BRCA-related cancers include: ? Breast. ? Ovarian. ? Tubal. ? Peritoneal cancers.  Results of the assessment  will determine the need for genetic counseling and BRCA1 and BRCA2 testing.  Cervical Cancer Your health care provider may recommend that you be screened regularly for cancer of the pelvic organs (ovaries, uterus, and vagina). This screening involves a pelvic examination, including checking for microscopic changes to the surface of your cervix (Pap test). You may be encouraged to have this screening done every 3 years, beginning at age 31.  For women ages 82-65, health care providers may recommend pelvic exams and Pap testing every 3 years, or they may recommend the Pap and pelvic exam, combined with testing for human papilloma virus (HPV), every 5 years. Some types of HPV increase your risk of cervical cancer. Testing for HPV may also be done on women of any age with unclear Pap test results.  Other health care providers may not recommend any screening for nonpregnant women who are considered low risk for pelvic cancer and who do not have symptoms. Ask your health care provider if a screening pelvic exam is right for you.  If you have had past treatment for cervical cancer or a condition that could lead to cancer, you need Pap tests and screening for cancer for at least 20 years after your treatment. If Pap tests have been discontinued, your risk factors (such as having a new sexual partner) need to be reassessed to determine if screening should resume. Some women have medical problems that increase the chance of getting cervical cancer. In these cases, your health care provider may recommend more frequent screening and Pap tests.  Colorectal Cancer  This type of cancer can be detected and often prevented.  Routine colorectal cancer screening usually begins at 47 years of age and continues through 47 years of age.  Your health care provider may recommend screening at an earlier age if you have risk factors for colon cancer.  Your health care provider may also recommend using home test kits to  check for hidden blood in the stool.  A small camera at the end of a tube can be used to examine your colon directly (sigmoidoscopy or colonoscopy). This is done to check for the earliest forms of colorectal cancer.  Routine screening usually begins at age 53.  Direct examination of the colon should be repeated every 5-10 years through 47 years of age. However, you may need to be screened more often if early forms of precancerous polyps or small growths are found.  Skin Cancer  Check your skin from head to toe regularly.  Tell your health care provider about any new moles or changes in moles, especially if there is a change in a mole's shape or color.  Also tell your health care provider if you have a mole that is larger than the size of a pencil eraser.  Always use sunscreen. Apply sunscreen liberally and repeatedly throughout the day.  Protect yourself by wearing long sleeves, pants, a wide-brimmed  hat, and sunglasses whenever you are outside.  Heart disease, diabetes, and high blood pressure  High blood pressure causes heart disease and increases the risk of stroke. High blood pressure is more likely to develop in: ? People who have blood pressure in the high end of the normal range (130-139/85-89 mm Hg). ? People who are overweight or obese. ? People who are African American.  If you are 68-81 years of age, have your blood pressure checked every 3-5 years. If you are 21 years of age or older, have your blood pressure checked every year. You should have your blood pressure measured twice-once when you are at a hospital or clinic, and once when you are not at a hospital or clinic. Record the average of the two measurements. To check your blood pressure when you are not at a hospital or clinic, you can use: ? An automated blood pressure machine at a pharmacy. ? A home blood pressure monitor.  If you are between 26 years and 65 years old, ask your health care provider if you should  take aspirin to prevent strokes.  Have regular diabetes screenings. This involves taking a blood sample to check your fasting blood sugar level. ? If you are at a normal weight and have a low risk for diabetes, have this test once every three years after 47 years of age. ? If you are overweight and have a high risk for diabetes, consider being tested at a younger age or more often. Preventing infection Hepatitis B  If you have a higher risk for hepatitis B, you should be screened for this virus. You are considered at high risk for hepatitis B if: ? You were born in a country where hepatitis B is common. Ask your health care provider which countries are considered high risk. ? Your parents were born in a high-risk country, and you have not been immunized against hepatitis B (hepatitis B vaccine). ? You have HIV or AIDS. ? You use needles to inject street drugs. ? You live with someone who has hepatitis B. ? You have had sex with someone who has hepatitis B. ? You get hemodialysis treatment. ? You take certain medicines for conditions, including cancer, organ transplantation, and autoimmune conditions.  Hepatitis C  Blood testing is recommended for: ? Everyone born from [birth year] through 1965. ? Anyone with known risk factors for hepatitis C.  Sexually transmitted infections (STIs)  You should be screened for sexually transmitted infections (STIs) including gonorrhea and chlamydia if: ? You are sexually active and are younger than 47 years of age. ? You are older than 47 years of age and your health care provider tells you that you are at risk for this type of infection. ? Your sexual activity has changed since you were last screened and you are at an increased risk for chlamydia or gonorrhea. Ask your health care provider if you are at risk.  If you do not have HIV, but are at risk, it may be recommended that you take a prescription medicine daily to prevent HIV infection. This is called  pre-exposure prophylaxis (PrEP). You are considered at risk if: ? You are sexually active and do not regularly use condoms or know the HIV status of your partner(s). ? You take drugs by injection. ? You are sexually active with a partner who has HIV.  Talk with your health care provider about whether you are at high risk of being infected with HIV. If you choose to begin  PrEP, you should first be tested for HIV. You should then be tested every 3 months for as long as you are taking PrEP. Pregnancy  If you are premenopausal and you may become pregnant, ask your health care provider about preconception counseling.  If you may become pregnant, take 400 to 800 micrograms (mcg) of folic acid every day.  If you want to prevent pregnancy, talk to your health care provider about birth control (contraception). Osteoporosis and menopause  Osteoporosis is a disease in which the bones lose minerals and strength with aging. This can result in serious bone fractures. Your risk for osteoporosis can be identified using a bone density scan.  If you are 62 years of age or older, or if you are at risk for osteoporosis and fractures, ask your health care provider if you should be screened.  Ask your health care provider whether you should take a calcium or vitamin D supplement to lower your risk for osteoporosis.  Menopause may have certain physical symptoms and risks.  Hormone replacement therapy may reduce some of these symptoms and risks. Talk to your health care provider about whether hormone replacement therapy is right for you. Follow these instructions at home:  Schedule regular health, dental, and eye exams.  Stay current with your immunizations.  Do not use any tobacco products including cigarettes, chewing tobacco, or electronic cigarettes.  If you are pregnant, do not drink alcohol.  If you are breastfeeding, limit how much and how often you drink alcohol.  Limit alcohol intake to no more  than 1 drink per day for nonpregnant women. One drink equals 12 ounces of beer, 5 ounces of wine, or 1 ounces of hard liquor.  Do not use street drugs.  Do not share needles.  Ask your health care provider for help if you need support or information about quitting drugs.  Tell your health care provider if you often feel depressed.  Tell your health care provider if you have ever been abused or do not feel safe at home. This information is not intended to replace advice given to you by your health care provider. Make sure you discuss any questions you have with your health care provider. Document Released: 03/26/2011 Document Revised: 02/16/2016 Document Reviewed: 06/14/2015 Elsevier Interactive Patient Education  2018 [employer].   IF you received an x-ray today, you will receive an invoice from [facility]. Please contact [facility] at [phone] with questions or concerns regarding your invoice.   IF you received labwork today, you will receive an invoice from [facility]. Please contact [facility] at [phone] with questions or concerns regarding your invoice.   Our billing staff will not be able to assist you with questions regarding bills from these companies.  You will be contacted with the lab results as soon as they are available. The fastest way to get your results is to activate your My Chart account. Instructions are located on the last page of this paperwork. If you have not heard from [place] regarding the results in 2 weeks, please contact this office.

## 2018-07-16 LAB — CMP14+EGFR
ALT: 12 IU/L (ref 0–32)
AST: 22 IU/L (ref 0–40)
Albumin/Globulin Ratio: 1.9 (ref 1.2–2.2)
Albumin: 4.6 g/dL (ref 3.5–5.5)
Alkaline Phosphatase: 48 IU/L (ref 39–117)
BUN/Creatinine Ratio: 12 (ref 9–23)
BUN: 10 mg/dL (ref 6–24)
Bilirubin Total: 0.3 mg/dL (ref 0.0–1.2)
CO2: 21 mmol/L (ref 20–29)
CREATININE: 0.82 mg/dL (ref 0.57–1.00)
Calcium: 9.1 mg/dL (ref 8.7–10.2)
Chloride: 105 mmol/L (ref 96–106)
GFR calc Af Amer: 99 mL/min/{1.73_m2} (ref >59)
GFR calc non Af Amer: 85 mL/min/{1.73_m2} (ref >59)
GLUCOSE: 82 mg/dL (ref 65–99)
Globulin, Total: 2.4 g/dL (ref 1.5–4.5)
Potassium: 4.4 mmol/L (ref 3.5–5.2)
Sodium: 140 mmol/L (ref 134–144)
Total Protein: 7 g/dL (ref 6.0–8.5)

## 2018-07-16 LAB — CBC WITH DIFFERENTIAL/PLATELET
BASOS ABS: 0.1 10*3/uL (ref 0.0–0.2)
Basos: 1 %
EOS (ABSOLUTE): 0.2 10*3/uL (ref 0.0–0.4)
EOS: 4 %
HEMATOCRIT: 41.3 % (ref 34.0–46.6)
HEMOGLOBIN: 13.9 g/dL (ref 11.1–15.9)
IMMATURE GRANS (ABS): 0 10*3/uL (ref 0.0–0.1)
Immature Granulocytes: 0 %
LYMPHS: 37 %
Lymphocytes Absolute: 2.4 10*3/uL (ref 0.7–3.1)
MCH: 29.6 pg (ref 26.6–33.0)
MCHC: 33.7 g/dL (ref 31.5–35.7)
MCV: 88 fL (ref 79–97)
Monocytes Absolute: 0.3 10*3/uL (ref 0.1–0.9)
Monocytes: 5 %
Neutrophils Absolute: 3.4 10*3/uL (ref 1.4–7.0)
Neutrophils: 53 %
Platelets: 281 10*3/uL (ref 150–450)
RBC: 4.7 x10E6/uL (ref 3.77–5.28)
RDW: 12.2 % — AB (ref 12.3–15.4)
WBC: 6.4 10*3/uL (ref 3.4–10.8)

## 2018-07-16 LAB — LIPID PANEL
CHOLESTEROL TOTAL: 188 mg/dL (ref 100–199)
Chol/HDL Ratio: 3.4 ratio (ref 0.0–4.4)
HDL: 55 mg/dL (ref >39)
LDL CALC: 119 mg/dL — AB (ref 0–99)
TRIGLYCERIDES: 71 mg/dL (ref 0–149)
VLDL Cholesterol Cal: 14 mg/dL (ref 5–40)

## 2018-07-16 LAB — TSH: TSH: 1.25 u[IU]/mL (ref 0.450–4.500)

## 2018-08-27 ENCOUNTER — Other Ambulatory Visit: Payer: Self-pay | Admitting: Physician Assistant

## 2018-08-27 MED ORDER — VENLAFAXINE HCL ER 37.5 MG PO CP24: 75.0000 mg | ORAL_CAPSULE | Freq: Every day | ORAL | 3 refills | Status: DC

## 2018-08-27 NOTE — Telephone Encounter (Signed)
Please advise on rx refill  

## 2018-08-27 NOTE — Telephone Encounter (Signed)
Requested medication (s) are due for refill today -yes  Requested medication (s) are on the active medication list -yes  Future visit scheduled -no  Last refill: 07/11/17- for 1 year  Notes to clinic: Patient was seen for annual exam and did not need RF at the time( 07/15/18)- she was told to call back when needed- patient was seen by provider no longer with practice. Sent for review for refill.  Requested Prescriptions  Pending Prescriptions Disp Refills   venlafaxine XR (EFFEXOR-XR) 37.5 MG 24 hr capsule 90 capsule 5    Sig: Take 2-3 capsules (75-112.5 mg total) by mouth daily with breakfast.     Psychiatry: Antidepressants - SNRI - desvenlafaxine & venlafaxine Failed - 08/27/2018 12:52 PM      Failed - LDL in normal range and within 360 days    LDL Calculated  Date Value Ref Range Status  07/15/2018 119 (H) 0 - 99 mg/dL Final         Failed - Last BP in normal range    BP Readings from Last 1 Encounters:  07/15/18 (!) 147/81         Passed - Total Cholesterol in normal range and within 360 days    Cholesterol, Total  Date Value Ref Range Status  07/15/2018 188 100 - 199 mg/dL Final         Passed - Triglycerides in normal range and within 360 days    Triglycerides  Date Value Ref Range Status  07/15/2018 71 0 - 149 mg/dL Final         Passed - Completed PHQ-2 or PHQ-9 in the last 360 days.      Passed - Valid encounter within last 6 months    Recent Outpatient Visits          1 month ago Annual physical exam   [facility], [place], PA-C   1 year ago Routine physical examination   [facility], Myrle ShengKristi M, MD   1 year ago Lice   [facility], Madelaine BhatElizabeth Whitney, PA-C   1 year ago Cough   [facility], Asencion PartridgeJeffrey R, MD   1 year ago Anxiety and depression   [facility], Myrle ShengKristi M, MD              Requested Prescriptions  Pending Prescriptions Disp Refills   venlafaxine XR  (EFFEXOR-XR) 37.5 MG 24 hr capsule 90 capsule 5    Sig: Take 2-3 capsules (75-112.5 mg total) by mouth daily with breakfast.     Psychiatry: Antidepressants - SNRI - desvenlafaxine & venlafaxine Failed - 08/27/2018 12:52 PM      Failed - LDL in normal range and within 360 days    LDL Calculated  Date Value Ref Range Status  07/15/2018 119 (H) 0 - 99 mg/dL Final         Failed - Last BP in normal range    BP Readings from Last 1 Encounters:  07/15/18 (!) 147/81         Passed - Total Cholesterol in normal range and within 360 days    Cholesterol, Total  Date Value Ref Range Status  07/15/2018 188 100 - 199 mg/dL Final         Passed - Triglycerides in normal range and within 360 days    Triglycerides  Date Value Ref Range Status  07/15/2018 71 0 - 149 mg/dL Final  Passed - Completed PHQ-2 or PHQ-9 in the last 360 days.      Passed - Valid encounter within last 6 months    Recent Outpatient Visits          1 month ago Annual physical exam   [facility] at [place], [place] D, PA-C   1 year ago Routine physical examination   Primary Care at [facility], Myrle Sheng, MD   1 year ago Lice   Primary Care at [facility], Madelaine Bhat, PA-C   1 year ago Cough   [facility], Asencion Partridge, MD   1 year ago Anxiety and depression   Primary [facility] at [facility], Myrle Sheng, MD

## 2018-08-27 NOTE — Telephone Encounter (Signed)
She is seen once a year for CPE Last OV oct 2019 with Barnett AbuWiseman

## 2018-08-27 NOTE — Telephone Encounter (Signed)
Copied from CRM [phone]. Topic: Quick Communication - Rx Refill/Question >> Aug 27, 2018 12:44 PM Lauren Ballard, Lauren Ballard wrote: Medication: venlafaxine XR (EFFEXOR-XR) 37.5 MG 24 hr capsule  Has the patient contacted their pharmacy? Yes.  Contacted pharmacy on 12.03.2019 and they advised pt to call Dr./ no refills available.  Pt states she has been with out meds for a  few days     (Preferred Pharmacy (with phone number or street name): [id number] [place] - [address] [place] [phone] (Phone) [phone] (Fax)

## 2018-10-01 ENCOUNTER — Telehealth: Payer: Self-pay | Admitting: Physician Assistant

## 2018-10-01 NOTE — Telephone Encounter (Signed)
Copied from CRM [id number]. Topic: Quick Communication - See Telephone Encounter >> Oct 01, 2018  2:36 PM Fanny Bien wrote: CRM for notification. See Telephone encounter for: 10/01/18. Pt called and stated that she would like to know if her thyroid was check during last physical. Please advise

## 2018-10-02 NOTE — Telephone Encounter (Signed)
Called and informed pt about her labs.

## 2019-02-04 ENCOUNTER — Other Ambulatory Visit: Payer: Self-pay | Admitting: Family Medicine

## 2019-02-04 NOTE — Telephone Encounter (Signed)
Requested medication (s) are due for refill today: yes  Requested medication (s) are on the active medication list: yes  Last refill:  08/27/18 for 90 and 3 refills  Future visit scheduled: no  Notes to clinic:  Med can not be delegated. Per protocol, pt needs an appointment for a refill.   Requested Prescriptions  Pending Prescriptions Disp Refills   venlafaxine XR (EFFEXOR-XR) 37.5 MG 24 hr capsule [Pharmacy Med Name: VENLAFAXINE HCL ER 37.5 MG CAP] 90 capsule 3    Sig: Take 2-3 capsules (75-112.5 mg total) by mouth daily with breakfast.     Psychiatry: Antidepressants - SNRI - desvenlafaxine & venlafaxine Failed - 02/04/2019  9:52 AM      Failed - LDL in normal range and within 360 days    LDL Calculated  Date Value Ref Range Status  07/15/2018 119 (H) 0 - 99 mg/dL Final         Failed - Last BP in normal range    BP Readings from Last 1 Encounters:  07/15/18 (!) 147/81         Failed - Valid encounter within last 6 months    Recent Outpatient Visits          6 months ago Annual physical exam   [facility] at [place], [place] D, PA-C   1 year ago Routine physical examination   [facility] at [facility], Myrle Sheng, MD   1 year ago Lice   Primary Care at [facility], [place]   1 year ago Cough   [facility], Asencion Partridge, MD   1 year ago Anxiety and depression   Primary Care at [facility], Myrle Sheng, MD             Passed - Total Cholesterol in normal range and within 360 days    Cholesterol, Total  Date Value Ref Range Status  07/15/2018 188 100 - 199 mg/dL Final         Passed - Triglycerides in normal range and within 360 days    Triglycerides  Date Value Ref Range Status  07/15/2018 71 0 - 149 mg/dL Final         Passed - Completed PHQ-2 or PHQ-9 in the last 360 days.

## 2019-02-28 ENCOUNTER — Other Ambulatory Visit: Payer: Self-pay | Admitting: Family Medicine

## 2019-03-01 NOTE — Telephone Encounter (Signed)
Will forward medication refill request to provider for review.

## 2019-03-03 NOTE — Telephone Encounter (Signed)
LVM to schedule appt

## 2019-08-07 ENCOUNTER — Telehealth: Payer: Self-pay | Admitting: Physician Assistant

## 2019-08-07 NOTE — Telephone Encounter (Signed)
Patient requesting ALPRAZolam (XANAX) 0.25 MG tablet informed patient please allow 48 to 72 hour turn around time.    [id number] [place], [place]

## 2019-08-07 NOTE — Telephone Encounter (Signed)
Pt needs to schedule an OV for refill request.

## 2019-08-10 NOTE — Telephone Encounter (Signed)
Patient has scheduled refill visit   And would like to request medication in the mean time

## 2019-08-13 NOTE — Telephone Encounter (Signed)
Pt can discuss medication refill at her appointment.

## 2019-08-19 ENCOUNTER — Ambulatory Visit: Payer: Managed Care, Other (non HMO) | Admitting: Registered Nurse

## 2020-01-01 ENCOUNTER — Telehealth: Payer: Self-pay

## 2020-01-26 NOTE — Telephone Encounter (Signed)
No action required.

## 2020-07-20 LAB — CULTURE, URINE: FINAL REPORT: NO GROWTH

## 2022-02-26 ENCOUNTER — Ambulatory Visit
Admit: 2022-02-26 | Discharge: 2022-02-26 | Payer: BLUE CROSS/BLUE SHIELD | Attending: Surgical | Primary: Obstetrics & Gynecology

## 2022-02-26 DIAGNOSIS — Z01419 Encounter for gynecological examination (general) (routine) without abnormal findings: Secondary | ICD-10-CM

## 2022-02-26 MED ORDER — VENLAFAXINE HCL ER 37.5 MG PO CP24
37.5 MG | ORAL_CAPSULE | Freq: Every day | ORAL | 3 refills | Status: DC
Start: 2022-02-26 — End: 2022-03-19

## 2022-02-26 NOTE — Progress Notes (Signed)
.  Chief Complaint   Patient presents with    Annual Exam      Patient presents today for a routine gynecological examination with no complaints.    Last seen by Dr.Weinstein 07/2020. She was started on OCP due to menorrhagia and intermenstrual spotting. TVUS performed and showed evidence of fibroid. Patient had PAP at this visit but unable to see results. States PAP was normal.     Stated OCP improved menstrual cycles. Stopped in January to see if she still needed it. Started having regular menstrual cycles about two months later. Menstrual cycles are heavy, changes pad several times a day. Denies intermenstrual bleeding.     Not currently sexually active, no contraception needed    Denies hot flashes but notes occasional night sweats. Some vaginal dryness.     Previously prescribed Venlafaxine by her PCP for depression. She no longer follows up with PCP but would like a refill. Her depression is well controlled on Venlafaxine. Denies any side effects. Also copes with exercise and healthy diet.     Last mammogram 07/2020, she is due     She has not had a colonoscopy but does not some recent abdominal bloating and diarrhea. She has cut out gluten and notes some improvement.     GYN History       GYN History  Menarche age:   Cycle Length21 to 35 days Lasting 7 days.  No LMP recorded.  STD history: none  Last Pap date: 07/2020  Abnormal Pap: No  Last Pap result: *NML  Contraception:  no method, not currently sexually active     OB History  OB History       Gravida   3    Para   2    Term   2    Preterm        AB   1    Living   1         SAB        IAB        Ectopic   1    Molar        Multiple        Live Births   1                 Past Medical History:  History reviewed. No pertinent past medical history.    Past Surgical History:  Past Surgical History:   Procedure Laterality Date    CATARACT EXTRACTION, BILATERAL      ECTOPIC PREGNANCY SURGERY      GALLBLADDER SURGERY         Allergies:   Allergies   Allergen  Reactions    Latex Rash    Bupropion Rash    Sulfa Antibiotics      Other reaction(s): Unknown    Amoxicillin Rash     Other reaction(s): Unknown    Elemental Sulfur Rash    Penicillin G Rash     Other reaction(s): Unknown    Penicillins Hives and Rash    Sulfur Rash       Medication History:  Current Outpatient Medications   Medication Sig Dispense Refill    venlafaxine (EFFEXOR XR) 37.5 MG extended release capsule Take 1 capsule by mouth daily (with breakfast) 90 capsule 3     No current facility-administered medications for this visit.       Social History:  Social History     Socioeconomic History    Marital  status: Divorced     Spouse name: Not on file    Number of children: Not on file    Years of education: Not on file    Highest education level: Not on file   Occupational History    Not on file   Tobacco Use    Smoking status: Never    Smokeless tobacco: Never   Vaping Use    Vaping Use: Never used   Substance and Sexual Activity    Alcohol use: Yes     Comment: occ    Drug use: Never    Sexual activity: Not Currently   Other Topics Concern    Not on file   Social History Narrative    Not on file     Social Determinants of Health     Financial Resource Strain: Not on file   Food Insecurity: Not on file   Transportation Needs: Not on file   Physical Activity: Not on file   Stress: Not on file   Social Connections: Not on file   Intimate Partner Violence: Not on file   Housing Stability: Not on file       Family History:  Family History   Problem Relation Age of Onset    Dementia Mother     Breast Cancer Mother     Alzheimer's Disease Father     No Known Problems Brother     Lung Cancer Maternal Grandfather     Breast Cancer Paternal Grandmother        Review of Systems   Constitutional:  Negative for chills and fever.        Denies hot flashes, occasional night sweats   Respiratory:  Negative for shortness of breath.    Cardiovascular:  Negative for chest pain and leg swelling.   Gastrointestinal:  Positive  for diarrhea (intermittent). Negative for abdominal pain, nausea and vomiting.   Genitourinary:  Positive for menstrual problem (menorrhagia, no intermenstrual bleeding). Negative for dysuria, pelvic pain, vaginal discharge and vaginal pain.   Neurological:  Negative for dizziness, syncope and headaches.   Psychiatric/Behavioral:  Negative for dysphoric mood. The patient is not nervous/anxious.        Objective:   BP 122/80 (Site: Left Upper Arm, Position: Sitting, Cuff Size: Small Adult)   Ht 5' (1.524 m)   Wt 122 lb 12.8 oz (55.7 kg)   LMP 02/11/2022 (Exact Date)   BMI 23.98 kg/m     Physical Exam  Constitutional:       Appearance: Normal appearance. She is normal weight.   Genitourinary:      Right Labia: No rash or tenderness.     Left Labia: No tenderness or rash.     No vaginal discharge, erythema or tenderness.      No vaginal prolapse present.     No vaginal atrophy present.       Right Adnexa: not tender and no mass present.     Left Adnexa: not tender and no mass present.     No cervical discharge, friability or lesion.      Uterus is not enlarged or tender.      Uterus is anteverted.   Breasts:     Right: No mass, nipple discharge, skin change or tenderness.      Left: No mass, nipple discharge, skin change or tenderness.   HENT:      Head: Normocephalic.   Neck:      Thyroid: No thyroid mass or thyromegaly.  Cardiovascular:      Rate and Rhythm: Normal rate and regular rhythm.      Heart sounds: No murmur heard.    No friction rub. No gallop.   Pulmonary:      Effort: Pulmonary effort is normal.      Breath sounds: Normal breath sounds. No wheezing, rhonchi or rales.   Abdominal:      General: Abdomen is flat.      Palpations: Abdomen is soft.      Tenderness: There is no abdominal tenderness. There is no guarding or rebound.   Musculoskeletal:      Cervical back: Neck supple.   Lymphadenopathy:      Upper Body:      Right upper body: No axillary adenopathy.      Left upper body: No axillary  adenopathy.   Neurological:      Mental Status: She is alert and oriented to person, place, and time.   Skin:     General: Skin is warm and dry.   Psychiatric:         Mood and Affect: Mood normal.         Behavior: Behavior normal.         Thought Content: Thought content normal.      Assessment/Plan:   Deborah Hester was seen today for annual exam.    Diagnoses and all orders for this visit:    Encounter for gynecological examination with Papanicolaou smear of cervix  -     PAP IG, Aptima HPV and rfx 16/18,45 ([id number])  ([facility] DEFAULT)    Screening mammogram for breast cancer  -     MAM TOMO DIGITAL SCREEN BILATERAL; Future    Encounter for screening for malignant neoplasm of colon  -     Cologuard (Fecal DNA Colorectal Cancer Screening)    Persistent depressive disorder  -     venlafaxine (EFFEXOR XR) 37.5 MG extended release capsule; Take 1 capsule by mouth daily (with breakfast)    Encounter to establish care with new doctor  -     [facility] - Deneise Lever, MD, Internal Medicine - [address] - [address]    Special screening examination for human papillomavirus (HPV)  -     PAP IG, Aptima HPV and rfx 16/18,45 ([id number])  ([facility] DEFAULT)       Patient does not want OCP for menorrhagia at this time. Will contact us if this changes.     Return in about 1 year (around 02/27/2023) for annual .     Kylie R Hyland PA-S2  Dannielle Karvonen, PA-C

## 2022-03-02 LAB — PAP IG, APTIMA HPV AND RFX 16/18,45 (199305)
.: 0
HPV Aptima: NEGATIVE

## 2022-03-19 ENCOUNTER — Encounter

## 2022-03-19 MED ORDER — SERTRALINE HCL 50 MG PO TABS
50 MG | ORAL_TABLET | Freq: Every day | ORAL | 3 refills | Status: AC
Start: 2022-03-19 — End: ?

## 2022-03-19 NOTE — Telephone Encounter (Signed)
Lvm for pt regarding her medication was sent to her pharmacy

## 2022-03-19 NOTE — Telephone Encounter (Signed)
Wrong medication sent to pharmacy, should be     Sertraline  Pharmacy verified  24-48 hrs informed

## 2022-03-20 ENCOUNTER — Ambulatory Visit: Payer: BLUE CROSS/BLUE SHIELD | Primary: Obstetrics & Gynecology

## 2022-03-20 DIAGNOSIS — Z1231 Encounter for screening mammogram for malignant neoplasm of breast: Secondary | ICD-10-CM

## 2022-05-27 LAB — FECAL DNA COLORECTAL CANCER SCREENING (COLOGUARD): FIT-DNA (Cologuard): NEGATIVE

## 2023-06-05 ENCOUNTER — Ambulatory Visit
Admit: 2023-06-05 | Discharge: 2023-06-05 | Payer: PRIVATE HEALTH INSURANCE | Attending: Student in an Organized Health Care Education/Training Program | Primary: Student in an Organized Health Care Education/Training Program

## 2023-06-05 VITALS — BP 104/68 | Ht 60.0 in | Wt 120.8 lb

## 2023-06-05 DIAGNOSIS — Z01419 Encounter for gynecological examination (general) (routine) without abnormal findings: Secondary | ICD-10-CM

## 2023-06-05 MED ORDER — SERTRALINE HCL 50 MG PO TABS
50 | ORAL_TABLET | Freq: Every day | ORAL | 3 refills | Status: DC
Start: 2023-06-05 — End: 2023-08-08

## 2023-06-05 NOTE — Progress Notes (Signed)
 Annual Gynecologic Exam    Patient presents today for a routine gynecological examination. Denies complaints or concerns.         06/05/2023    12:02 PM   PHQ-9    Little interest or pleasure in doing things 0   Feeling down, depressed, or hopeless 0   Trouble falling or staying asleep, or sleeping too much 0   Feeling tired or having little energy 0   Poor appetite or overeating 0   Feeling bad about yourself - or that you are a failure or have let yourself or your family down 0   Trouble concentrating on things, such as reading the newspaper or watching television 0   Moving or speaking so slowly that other people could have noticed. Or the opposite - being so fidgety or restless that you have been moving around a lot more than usual 0   Thoughts that you would be better off dead, or of hurting yourself in some way 0   PHQ-2 Score 0   PHQ-9 Total Score 0   If you checked off any problems, how difficult have these problems made it for you to do your work, take care of things at home, or get along with other people? 0       GYN History         Patient's last menstrual period was 05/26/2023 (approximate).   STD History: denies   Abnormal paps: denies recent abnormal pap  Last pap 02/26/22: nml  Sexual activity: yes  Contraception: denies  Reports still getting a monthly period but says it is lighter than it used to be.     Health maintenance:  Last pap: 1 year ago, nml  Mammogram: >1 year ago  Colon cancer screening: cologuard 2023  Bone density:    OB History       Gravida   3    Para   2    Term   2    Preterm        AB   1    Living   1         SAB        IAB        Ectopic   1    Molar        Multiple        Live Births   1                Past Medical History:  Past Medical History:   Diagnosis Date    Ectopic pregnancy        Past Surgical History:  Past Surgical History:   Procedure Laterality Date    CATARACT EXTRACTION, BILATERAL      ECTOPIC PREGNANCY SURGERY      GALLBLADDER SURGERY         Allergies:    Allergies   Allergen Reactions    Latex Rash    Bupropion Rash    Sulfa Antibiotics      Other reaction(s): Unknown    Amoxicillin Rash     Other reaction(s): Unknown    Elemental Sulfur Rash    Penicillin G Rash     Other reaction(s): Unknown    Penicillins Hives and Rash    Sulfur Rash       Medication History:  Current Outpatient Medications   Medication Sig Dispense Refill    sertraline  (ZOLOFT ) 50 MG tablet Take 1 tablet by mouth daily 90 tablet 3  No current facility-administered medications for this visit.       Social History:  Social History     Socioeconomic History    Marital status: Divorced     Spouse name: Not on file    Number of children: Not on file    Years of education: Not on file    Highest education level: Not on file   Occupational History    Not on file   Tobacco Use    Smoking status: Never    Smokeless tobacco: Never   Vaping Use    Vaping status: Never Used   Substance and Sexual Activity    Alcohol use: Yes     Alcohol/week: 2.0 standard drinks of alcohol     Types: 1 Glasses of wine, 1 Drinks containing 0.5 oz of alcohol per week     Comment: occ    Drug use: Never    Sexual activity: Not Currently     Partners: Female   Other Topics Concern    Not on file   Social History Narrative    Not on file     Social Determinants of Health     Financial Resource Strain: Low Risk  (07/15/2018)    Received from [facility]    Overall Financial Resource Strain (CARDIA)     Difficulty of Paying Living Expenses: Not hard at all   Food Insecurity: No Food Insecurity (07/15/2018)    Received from [facility]    Hunger Vital Sign     Worried About Running Out of Food in the Last Year: Never true     Ran Out of Food in the Last Year: Never true   Transportation Needs: No Transportation Needs (07/15/2018)    Received from [facility]     Lack of Transportation (Medical): No     Lack of Transportation (Non-Medical): No   Physical Activity: Sufficiently Active (07/15/2018)     Received from [facility]    Exercise Vital Sign     Days of Exercise per Week: 6 days     Minutes of Exercise per Session: 60 min   Stress: Not on file   Social Connections: Unknown (07/15/2018)    Received from [facility]    Social Connection and Isolation Panel [NHANES]     Frequency of Communication with Friends and Family: Not asked     Frequency of Social Gatherings with Friends and Family: Once a week     Attends Religious Services: Never     Database Administrator or Organizations: No     Attends Banker Meetings: Never     Marital Status: Divorced   Catering Manager Violence: Unknown (07/15/2018)    [facility]    Humiliation, Afraid, Rape, and Kick questionnaire     Fear of Current or Ex-Partner: Patient declined     Emotionally Abused: Patient declined     Physically Abused: Patient declined     Sexually Abused: Patient declined   Housing Stability: Not on file       Family History:  Family History   Problem Relation Age of Onset    Dementia Mother     Breast Cancer Mother     Alzheimer's Disease Father     No Known Problems Brother     Lung Cancer Maternal Grandfather     Breast Cancer Paternal Grandmother        Objective:   BP 104/68  Ht 1.524 m (5')   Wt 54.8 kg (120 lb 12.8 oz)   LMP 05/26/2023 (Approximate)   BMI 23.59 kg/m   The patient appears well, alert, oriented x 3, in no distress.  HEENT: normal.  Neck supple.   Lungs: normal respiratory effort  Heart: regular rate  Abdomen: soft without tenderness, guarding, mass or organomegaly.   Extremities show no edema   Neurological is normal, no focal findings.    BREAST EXAM: breasts appear normal, no suspicious masses, no skin or nipple changes or axillary nodes    Exam chaperoned by MA    Assessment/Plan:   1. Well woman exam  Comments:  Pap and colon ca screening up to date. Mammogram ordered. Denies c/f STIs.  2. Persistent depressive disorder  Comments:  Zoloft  refill provided per patient request  Orders:  -      sertraline  (ZOLOFT ) 50 MG tablet; Take 1 tablet by mouth daily, Disp-90 tablet, R-3Normal  3. Encounter for screening mammogram for malignant neoplasm of breast  -     MAM TOMO DIGITAL SCREEN BILATERAL; Future    ACOG recommendations for pap smear screening reviewed with the patient if applicable.  We discussed lifestyle modifications that would be appropriate. Questions were answered. Discussed age appropriate screening tests. Age appropriate anticipatory guidance discussed.    Return in about 1 year (around 06/04/2024) for WWE.    Tinnie FORBES Hummer, MD

## 2023-08-03 ENCOUNTER — Inpatient Hospital Stay
Admit: 2023-08-03 | Payer: PRIVATE HEALTH INSURANCE | Primary: Student in an Organized Health Care Education/Training Program

## 2023-08-03 DIAGNOSIS — Z1231 Encounter for screening mammogram for malignant neoplasm of breast: Secondary | ICD-10-CM

## 2023-08-07 NOTE — Telephone Encounter (Signed)
 Patient was not taking Zoloft as prescribed and now she can not get a refill until 11/18. She was taking the medication one tablet one day and then two the next and so on an so forth. She would like to know if Dr. Janee Morn can authorize an early refill or

## 2023-08-08 ENCOUNTER — Encounter

## 2023-08-08 MED ORDER — SERTRALINE HCL 50 MG PO TABS
50 | ORAL_TABLET | Freq: Every day | ORAL | 3 refills | Status: DC
Start: 2023-08-08 — End: 2023-08-09

## 2023-08-08 NOTE — Telephone Encounter (Signed)
 Patient says Rushie Chestnut is stating Zoloft to early to fill and she can not get it until 11/18. She would like to know if Dr. Janee Morn could send in a 5 day supply so she can fill it or authorize an early refill. Please assist.

## 2023-08-08 NOTE — Telephone Encounter (Signed)
 LVM

## 2023-08-09 ENCOUNTER — Encounter: Admit: 2023-08-09 | Admitting: Student in an Organized Health Care Education/Training Program

## 2023-08-09 DIAGNOSIS — F341 Dysthymic disorder: Secondary | ICD-10-CM

## 2023-08-09 MED ORDER — SERTRALINE HCL 50 MG PO TABS
50 | ORAL_TABLET | Freq: Every day | ORAL | 3 refills | Status: DC
Start: 2023-08-09 — End: 2023-08-12

## 2023-08-09 NOTE — Telephone Encounter (Signed)
 Pt called in regarding directions. Please advise.

## 2023-08-09 NOTE — Telephone Encounter (Signed)
 Pt returning call to [facility]. Pt stated what we sent in is not what she asked for. She would like to know if Zoloft if the mg can be increase or if the RX can be written to take 2 pills a day. Pt stated her other provider had her on 2 pills a day and that

## 2023-08-09 NOTE — Telephone Encounter (Signed)
 LVM advising patient the RX has been sent.

## 2023-08-12 MED ORDER — SERTRALINE HCL 100 MG PO TABS
100 | ORAL_TABLET | Freq: Every day | ORAL | 11 refills | Status: AC
Start: 2023-08-12 — End: ?

## 2023-08-12 NOTE — Telephone Encounter (Signed)
 Spoke w/ patient. Advised to take 100MG  per day per Dr Janee Morn.

## 2024-04-10 ENCOUNTER — Encounter: Payer: Self-pay | Admitting: Advanced Practice Midwife

## 2024-09-21 ENCOUNTER — Inpatient Hospital Stay
Admit: 2024-09-21 | Payer: PRIVATE HEALTH INSURANCE | Primary: Student in an Organized Health Care Education/Training Program

## 2024-09-21 ENCOUNTER — Encounter

## 2024-09-21 DIAGNOSIS — Z1231 Encounter for screening mammogram for malignant neoplasm of breast: Secondary | ICD-10-CM

## 2024-10-28 ENCOUNTER — Ambulatory Visit
Admit: 2024-10-28 | Discharge: 2024-10-28 | Payer: PRIVATE HEALTH INSURANCE | Attending: Student in an Organized Health Care Education/Training Program | Primary: Student in an Organized Health Care Education/Training Program

## 2024-10-28 DIAGNOSIS — Z01419 Encounter for gynecological examination (general) (routine) without abnormal findings: Principal | ICD-10-CM

## 2024-10-28 NOTE — Progress Notes (Signed)
 Annual Gynecologic Exam    Patient presents today for a routine gynecological examination.        06/05/2023    12:02 PM   PHQ-9    Little interest or pleasure in doing things 0   Feeling down, depressed, or hopeless 0   Trouble falling or staying asleep, or sleeping too much 0   Feeling tired or having little energy 0   Poor appetite or overeating 0   Feeling bad about yourself - or that you are a failure or have let yourself or your family down 0   Trouble concentrating on things, such as reading the newspaper or watching television 0   Moving or speaking so slowly that other people could have noticed. Or the opposite - being so fidgety or restless that you have been moving around a lot more than usual 0   Thoughts that you would be better off dead, or of hurting yourself in some way 0   PHQ-2 Score 0    PHQ-9 Total Score 0    If you checked off any problems, how difficult have these problems made it for you to do your work, take care of things at home, or get along with other people? 0       Data saved with a previous flowsheet row definition       GYN History         Patient's last menstrual period was 08/13/2024.   Menses irregular. Last menses in November. Says when she has bled, it has been heavy. Hgb 8.5, has colonoscopy scheduled for Friday.   Sexual activity: denies  STD History: denies   Contraception: none      Health maintenance:  Last pap: 02/26/22 NILM, HPV neg  Abnormal paps: denies  Mammogram: 08/2024  Colon cancer screening: scheduled for Friday  Bone density: n/a      OB History       Gravida   3    Para   2    Term   2    Preterm        AB   1    Living   1         SAB        IAB        Ectopic   1    Molar        Multiple        Live Births   1                Past Medical History:  Past Medical History:   Diagnosis Date    Anemia     Ectopic pregnancy        Past Surgical History:  Past Surgical History:   Procedure Laterality Date    CATARACT EXTRACTION, BILATERAL      ECTOPIC PREGNANCY SURGERY       GALLBLADDER SURGERY         Allergies:   Allergies   Allergen Reactions    Latex Rash    Bupropion Rash    Sulfa Antibiotics      Other reaction(s): Unknown    Amoxicillin Rash     Other reaction(s): Unknown    Elemental Sulfur Rash    Penicillin G Rash     Other reaction(s): Unknown    Penicillins Hives and Rash    Sulfur Rash       Medication History:  Current Outpatient Medications   Medication Sig Dispense Refill  sertraline  (ZOLOFT ) 100 MG tablet Take 1 tablet by mouth daily 30 tablet 11     No current facility-administered medications for this visit.       Social History:  Social History     Socioeconomic History    Marital status: Divorced     Spouse name: Not on file    Number of children: Not on file    Years of education: Not on file    Highest education level: Not on file   Occupational History    Not on file   Tobacco Use    Smoking status: Never    Smokeless tobacco: Never   Vaping Use    Vaping status: Never Used   Substance and Sexual Activity    Alcohol use: Yes     Alcohol/week: 2.0 standard drinks of alcohol     Types: 1 Glasses of wine, 1 Drinks containing 0.5 oz of alcohol per week     Comment: occ    Drug use: Never    Sexual activity: Not Currently     Partners: Female   Other Topics Concern    Not on file   Social History Narrative    Not on file     Social Drivers of Health     Financial Resource Strain: Low Risk (07/15/2018)    Received from [facility]    Overall Financial Resource Strain (CARDIA)     Difficulty of Paying Living Expenses: Not hard at all   Food Insecurity: No Food Insecurity (07/15/2018)    Received from [facility]    Hunger Vital Sign     Worried About Running Out of Food in the Last Year: Never true     Ran Out of Food in the Last Year: Never true   Transportation Needs: No Transportation Needs (07/15/2018)    Received from [facility]     Lack of Transportation (Medical): No     Lack of Transportation (Non-Medical): No   Physical  Activity: Sufficiently Active (07/15/2018)    Received from [facility]    Exercise Vital Sign     Days of Exercise per Week: 6 days     Minutes of Exercise per Session: 60 min   Stress: Not on file   Social Connections: Unknown (07/15/2018)    Received from [facility]    Social Connection and Isolation Panel     Frequency of Communication with Friends and Family: Not asked     Frequency of Social Gatherings with Friends and Family: Once a week     Attends Religious Services: Never     Database Administrator or Organizations: No     Attends Banker Meetings: Never     Marital Status: Divorced   Catering Manager Violence: Unknown (07/15/2018)    Received from [facility]    Humiliation, Afraid, Rape, and Kick questionnaire     Fear of Current or Ex-Partner: Patient declined     Emotionally Abused: Patient declined     Physically Abused: Patient declined     Sexually Abused: Patient declined   Housing Stability: Not on file       Tobacco History:  reports that she has never smoked. She has never used smokeless tobacco.   Alcohol Abuse:  reports current alcohol use of about 2.0 standard drinks of alcohol per week.   Drug Abuse:  reports no history of drug use.     Family History:  Family History   Problem Relation Age of Onset    Dementia Mother     Breast Cancer Mother     Alzheimer's Disease Father     No Known Problems Brother     Lung Cancer Maternal Grandfather     Breast Cancer Paternal Grandmother        Objective:   BP 118/80 (BP Site: Right Upper Arm, Patient Position: Sitting, BP Cuff Size: Medium Adult)   Ht 1.524 m (5')   Wt 55.8 kg (123 lb)   LMP 08/13/2024   BMI 24.02 kg/m   The patient appears well, alert, oriented x 3, in no distress.  HEENT: normal.  Neck supple.   Lungs: normal respiratory effort  Heart: regular rate  Abdomen: soft without tenderness, guarding, mass or organomegaly.   Extremities show no edema   Neurological is normal, no focal findings.    BREAST EXAM: breasts  appear normal, no suspicious masses, no skin or nipple changes or axillary nodes    PELVIC EXAM:   VULVA: normal appearing vulva with no masses, tenderness or lesions  URETHRA: normal, no discharge  VAGINA: normal appearing vagina with normal color and discharge, no lesions  CERVIX: normal appearing cervix without discharge or lesions  UTERUS: uterus is normal size, shape, consistency and nontender  ADNEXA: nontender and no masses  PELVIC SUPPORT DEFECTS: not seen  ANUS/PERINEUM: normal externally      Assessment/Plan:   1. Women's annual routine gynecological examination  2. Anemia, unspecified type  -     [facility] - Terrea Pina, MD, Hematology & Oncology - [facility]      Routine gynecologic examination  Her current medical status is satisfactory with no evidence of significant gynecologic issues.  Pap up to date. Declined STI testing. Denies bothersome VMS. Not currently on meds. Mammogram up to date.  Reviewed [facility] cervical cancer screening, mammogram, bone density, colonoscopy testing guidelines.  Counseled re: diet, exercise, healthy lifestyle. Educated on breast self awareness.   Questions were answered. Age appropriate anticipatory guidance discussed.  Return for yearly wellness visits    -Reports recent hgb of 8.3. Says she had full iron studies-- unable to see labs in epic.   -Discussed low suspicion that anemia is related to vaginal bleeding given last bleeding was in November  -Has appt for colonoscopy on Friday  -Discussed referral to hematologist to discuss anemia further- referral placed to Dr. Terrea. Encouraged patient to bring labs with her to this appt.     Return in about 1 year (around 10/28/2025) for WWE.    Tinnie FORBES Hummer, MD
